# Patient Record
Sex: Female | Born: 1995 | Hispanic: Yes | Marital: Single | Smoking: Never smoker
Health system: Southern US, Community
[De-identification: ages and names within clinical notes are randomized; demographics above are authoritative.]

## PROBLEM LIST (undated history)

## (undated) DIAGNOSIS — J45909 Unspecified asthma, uncomplicated: Secondary | ICD-10-CM

## (undated) DIAGNOSIS — Z789 Other specified health status: Secondary | ICD-10-CM

## (undated) HISTORY — DX: Unspecified asthma, uncomplicated: J45.909

## (undated) HISTORY — PX: TONSILLECTOMY: SUR1361

---
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
## 2014-03-16 ENCOUNTER — Other Ambulatory Visit: Payer: Self-pay | Admitting: Pediatrics

## 2014-03-16 LAB — GLUCOSE, 2 HOUR
Glucose 2 Hour: 101 mg/dL
Glucose Fasting: 99 mg/dL (ref 70–110)

## 2017-11-19 LAB — HM PAP SMEAR: HM Pap smear: NEGATIVE

## 2017-12-25 ENCOUNTER — Encounter: Payer: Self-pay | Admitting: Obstetrics and Gynecology

## 2018-01-13 ENCOUNTER — Encounter: Payer: Self-pay | Admitting: Obstetrics and Gynecology

## 2018-01-13 ENCOUNTER — Ambulatory Visit (INDEPENDENT_AMBULATORY_CARE_PROVIDER_SITE_OTHER): Payer: BLUE CROSS/BLUE SHIELD | Admitting: Obstetrics and Gynecology

## 2018-01-13 VITALS — BP 112/50 | HR 92 | Ht 63.0 in | Wt 256.0 lb

## 2018-01-13 DIAGNOSIS — Z3009 Encounter for other general counseling and advice on contraception: Secondary | ICD-10-CM | POA: Diagnosis not present

## 2018-01-13 NOTE — Progress Notes (Signed)
Obstetrics & Gynecology Office Visit   Chief Complaint:  Chief Complaint  Patient presents with  . COnsultation birthcontrol    Discuss options    History of Present Illness: Patient is a 22 y.o. G0P0000 presenting for contraception consult.  She is currently on none and desiring to start Nexplanon.  She has a past medical history significant for no contraindication to estrogen.  She specifically denies a history of history of DVT/PE and smoking.  Reported Patient's last menstrual period was 12/26/2017 (approximate).     Review of Systems: Review of systems negative unless otherwise noted in HPI  Past Medical History:  Past Medical History:  Diagnosis Date  . Asthma     Past Surgical History:  History reviewed. No pertinent surgical history.  Gynecologic History: Patient's last menstrual period was 12/26/2017 (approximate).  Obstetric History: G0P0000  Family History:  History reviewed. No pertinent family history.  Social History:  Social History   Socioeconomic History  . Marital status: Single    Spouse name: Not on file  . Number of children: Not on file  . Years of education: Not on file  . Highest education level: Not on file  Occupational History  . Not on file  Social Needs  . Financial resource strain: Not on file  . Food insecurity:    Worry: Not on file    Inability: Not on file  . Transportation needs:    Medical: Not on file    Non-medical: Not on file  Tobacco Use  . Smoking status: Never Smoker  . Smokeless tobacco: Never Used  Substance and Sexual Activity  . Alcohol use: Yes    Comment: Occ  . Drug use: Never  . Sexual activity: Yes    Partners: Male    Birth control/protection: None  Lifestyle  . Physical activity:    Days per week: 2 days    Minutes per session: 30 min  . Stress: Not at all  Relationships  . Social connections:    Talks on phone: Not on file    Gets together: Not on file    Attends religious service: Not on  file    Active member of club or organization: Not on file    Attends meetings of clubs or organizations: Not on file    Relationship status: Not on file  . Intimate partner violence:    Fear of current or ex partner: Not on file    Emotionally abused: Not on file    Physically abused: Not on file    Forced sexual activity: Not on file  Other Topics Concern  . Not on file  Social History Narrative  . Not on file    Allergies:  No Known Allergies  Medications: Prior to Admission medications   Not on File    Physical Exam Vitals:  Vitals:   01/13/18 1411  BP: (!) 112/50  Pulse: 92   Patient's last menstrual period was 12/26/2017 (approximate).  General: NAD HEENT: normocephalic, anicteric Pulmonary: No increased work of breathing Extremities: no edema, erythema, or tenderness Neurologic: Grossly intact Psychiatric: mood appropriate, affect full  Female chaperone present for pelvic  portions of the physical exam  Assessment: 22 y.o. G0P0000 contraception consult  Plan: Problem List Items Addressed This Visit    None    Visit Diagnoses    Encounter for counseling regarding contraception    -  Primary     - Pap and GC/CT offered but had done at  health department 2 months ago  - Reviewed all forms of birth control options available including abstinence; over the counter/barrier methods; hormonal contraceptive medication including pill, patch, ring, injection,contraceptive implant; hormonal and nonhormonal IUDs; permanent sterilization options including vasectomy and the various tubal sterilization modalities. Risks and benefits reviewed.  Questions were answered.  Information was given to patient to review.  Most interested in IUD vs nexplanon  -Risks and benefits of IUD discussed including the risks of irregular bleeding, cramping, infection, malpositioning, expulsion or uterine perforation of the IUD (1:1000 placements)  which may require further procedures such as  laparoscopy.  IUDs while effective at preventing pregnancy do not prevent transmission of sexually transmitted diseases and use of barrier methods for this purpose was discussed.  Low overall incidence of failure with 99.7% efficacy rate in typical use.  The patient has not contraindication to IUD placement.  -She understands that Nexplanon is a progesterone only therapy, and that patients often patients have irregular and unpredictable vaginal bleeding or amenorrhea. She understands that other side effects are possible related to systemic progesterone, including but not limited to, headaches, breast tenderness, nausea, and irritability. While effective at preventing pregnancy long acting reversible contraceptives do not prevent transmission of sexually transmitted diseases and use of barrier methods for this purpose was discussed.  The placement procedure for Nexplanon was reviewed with the patient in detail including risks of nerve injury, infection, bleeding and injury to other muscles or tendons. She understands that the Nexplanon implant is good for 3 years and needs to be removed at the end of that time.  She understands that Nexplanon is an extremely effective option for contraception, with failure rate of <1%.  A total of 20 minutes were spent in face-to-face contact with the patient during this encounter with over half of that time devoted to counseling and coordination of care.  Return call with next menses for nexpalnon or kylena.   Vena Austria, MD, [facility], [facility] 01/13/2018, 7:59 PM

## 2018-01-13 NOTE — Patient Instructions (Signed)

## 2018-01-26 ENCOUNTER — Encounter: Payer: Self-pay | Admitting: Obstetrics and Gynecology

## 2018-01-26 ENCOUNTER — Ambulatory Visit (INDEPENDENT_AMBULATORY_CARE_PROVIDER_SITE_OTHER): Payer: BLUE CROSS/BLUE SHIELD | Admitting: Obstetrics and Gynecology

## 2018-01-26 ENCOUNTER — Telehealth: Payer: Self-pay | Admitting: Obstetrics and Gynecology

## 2018-01-26 VITALS — BP 106/70 | HR 73 | Ht 63.0 in | Wt 256.0 lb

## 2018-01-26 DIAGNOSIS — Z3009 Encounter for other general counseling and advice on contraception: Secondary | ICD-10-CM

## 2018-01-26 NOTE — Telephone Encounter (Signed)
Patient scheduled 5/17 for [facility] with AMS

## 2018-01-27 NOTE — Progress Notes (Signed)
Desire Kylena none in office, will order return in 3 days

## 2018-01-29 ENCOUNTER — Encounter: Payer: Self-pay | Admitting: Obstetrics and Gynecology

## 2018-01-29 ENCOUNTER — Ambulatory Visit (INDEPENDENT_AMBULATORY_CARE_PROVIDER_SITE_OTHER): Payer: BLUE CROSS/BLUE SHIELD | Admitting: Obstetrics and Gynecology

## 2018-01-29 VITALS — BP 110/82 | HR 78 | Wt 252.0 lb

## 2018-01-29 DIAGNOSIS — Z30430 Encounter for insertion of intrauterine contraceptive device: Secondary | ICD-10-CM | POA: Diagnosis not present

## 2018-01-29 NOTE — Progress Notes (Signed)
   GYNECOLOGY OFFICE PROCEDURE NOTE  Erica Le is a 22 y.o. G0P0000 here for [facility] a IUD insertion. No GYN concerns.  Last pap smear was with last year at [facility] and was normal.  The patient is currently using nothing for contraception and her LMP is Patient's last menstrual period was 01/23/2018.[name]  The indication for her IUD is contraception/cycle control.  Negative predictive value of 99%-100% - < OR = 7 days from the onset of a normal menstrual cycle - has not had sexual intercourse since the start of the last menstrual cycle - has correctly and consistently used a reliable form of contraception - < or = 7 days from an induced abortion - is within 4 weeks postpartum - is exclusively breast feeding (>85% of feeds), amenorrheic, and <6 months postpartum   "[place] Selected Practice Recommendations for Contraceptive Use", April 13, 2015   IUD Insertion Procedure Note Patient identified, informed consent performed, consent signed.   Discussed risks of irregular bleeding, cramping, infection, malpositioning, expulsion or uterine perforation of the IUD (1:1000 placements)  which may require further procedure such as laparoscopy.  IUD while effective at preventing pregnancy do not prevent transmission of sexually transmitted diseases and use of barrier methods for this purpose was discussed. Time out was performed.  Urine pregnancy test negative.  Speculum placed in the vagina.  Cervix visualized.  Cleaned with Betadine x 2.  Grasped anteriorly with a single tooth tenaculum.  Uterus sounded to 8cm cm. IUD placed per manufacturer's recommendations.  Strings trimmed to 3 cm. Tenaculum was removed, good hemostasis noted.  Patient tolerated procedure well.   Patient was given post-procedure instructions.  She was advised to have backup contraception for one week.  Patient was also asked to check IUD strings periodically and follow up in 6 weeks for IUD check.  Vena Austria, MD, [name],  [facility]

## 2018-02-09 NOTE — Telephone Encounter (Signed)
Kyleena received & charged 01/29/18

## 2018-03-12 ENCOUNTER — Encounter: Payer: Self-pay | Admitting: Obstetrics and Gynecology

## 2018-03-12 ENCOUNTER — Ambulatory Visit (INDEPENDENT_AMBULATORY_CARE_PROVIDER_SITE_OTHER): Payer: BLUE CROSS/BLUE SHIELD | Admitting: Obstetrics and Gynecology

## 2018-03-12 VITALS — BP 106/72 | HR 86 | Ht 63.0 in | Wt 252.0 lb

## 2018-03-12 DIAGNOSIS — Z30431 Encounter for routine checking of intrauterine contraceptive device: Secondary | ICD-10-CM

## 2018-03-12 NOTE — Progress Notes (Signed)
Obstetrics & Gynecology Office Visit   Chief Complaint:  Chief Complaint  Patient presents with  . Follow-up    IUD check     History of Present Illness: 22 y.o. patient presenting for follow up of Kylena IUD placement 6+ weeks ago.  The indication for her IUD was contraception.  She denies any complications since her IUD placement.  Still having some occasional spotting.  is not able to feel strings.  Continues to have bleeding, light, not associated with significant cramping.  Review of Systems: Review of Systems  Constitutional: Negative.   Gastrointestinal: Negative.   Skin: Negative.     Past Medical History:  Past Medical History:  Diagnosis Date  . Asthma     Past Surgical History:  No past surgical history on file.  Gynecologic History: No LMP recorded.  Obstetric History: G0P0000  Family History:  No family history on file.  Social History:  Social History   Socioeconomic History  . Marital status: Single    Spouse name: Not on file  . Number of children: Not on file  . Years of education: Not on file  . Highest education level: Not on file  Occupational History  . Not on file  Social Needs  . Financial resource strain: Not on file  . Food insecurity:    Worry: Not on file    Inability: Not on file  . Transportation needs:    Medical: Not on file    Non-medical: Not on file  Tobacco Use  . Smoking status: Never Smoker  . Smokeless tobacco: Never Used  Substance and Sexual Activity  . Alcohol use: Yes    Comment: Occ  . Drug use: Never  . Sexual activity: Yes    Partners: Male    Birth control/protection: None  Lifestyle  . Physical activity:    Days per week: 2 days    Minutes per session: 30 min  . Stress: Not at all  Relationships  . Social connections:    Talks on phone: Not on file    Gets together: Not on file    Attends religious service: Not on file    Active member of club or organization: Not on file    Attends  meetings of clubs or organizations: Not on file    Relationship status: Not on file  . Intimate partner violence:    Fear of current or ex partner: Not on file    Emotionally abused: Not on file    Physically abused: Not on file    Forced sexual activity: Not on file  Other Topics Concern  . Not on file  Social History Narrative  . Not on file    Allergies:  No Known Allergies  Medications: Prior to Admission medications   Medication Sig Start Date End Date Taking? Authorizing Provider  Levonorgestrel (KYLEENA) 19.5 MG IUD by Intrauterine route.   Yes [provider]    Physical Exam Blood pressure 106/72, pulse 86, height 5\' 3"  (1.6 m), weight 252 lb (114.3 kg), SpO2 98 %. No LMP recorded.  General: NAD HEENT: normocephalic, anicteric Pulmonary: No increased work of breathing  Genitourinary:  External: Normal external female genitalia.  Normal urethral meatus, normal  Bartholin's and Skene's glands.    Vagina: Normal vaginal mucosa, no evidence of prolapse.    Cervix: Grossly normal in appearance, no bleeding, IUD strings visualized 2cm  Uterus: Non-enlarged, mobile, normal contour.  No CMT  Adnexa: ovaries non-enlarged, no adnexal  masses  Rectal: deferred  Lymphatic: no evidence of inguinal lymphadenopathy Extremities: no edema, erythema, or tenderness Neurologic: Grossly intact Psychiatric: mood appropriate, affect full  Female chaperone present for pelvic and breast  portions of the physical exam  Assessment: 22 y.o. G0P0000 IUD string check  Plan: Problem List Items Addressed This Visit    None    Visit Diagnoses    IUD check up    -  Primary       1.  The patient was given instructions to check her IUD strings monthly and call with any problems or concerns.  She should call for fevers, chills, abnormal vaginal discharge, pelvic pain, or other complaints.  2.   IUDs while effective at preventing pregnancy do not prevent transmission of sexually  transmitted diseases and use of barrier methods for this purpose was discussed.  Low overall incidence of failure with 99.7% efficacy rate in typical use.  The patient has not contraindication to IUD placement.  3.  She will return for a annual exam in 1 year.  All questions answered.  4) A total of 15 minutes were spent in face-to-face contact with the patient during this encounter with over half of that time devoted to counseling and coordination of care.  5) Return in about 1 year (around 03/13/2019) for annual.   Vena Austria, MD, [name], [facility] 03/12/2018, 4:10 PM

## 2018-07-13 LAB — HM HIV SCREENING LAB: HM HIV Screening: NEGATIVE

## 2018-08-05 ENCOUNTER — Ambulatory Visit (INDEPENDENT_AMBULATORY_CARE_PROVIDER_SITE_OTHER): Payer: BLUE CROSS/BLUE SHIELD | Admitting: Obstetrics and Gynecology

## 2018-08-05 ENCOUNTER — Encounter: Payer: Self-pay | Admitting: Obstetrics and Gynecology

## 2018-08-05 VITALS — BP 104/73 | HR 72 | Ht 63.0 in | Wt 238.0 lb

## 2018-08-05 DIAGNOSIS — N939 Abnormal uterine and vaginal bleeding, unspecified: Secondary | ICD-10-CM

## 2018-08-05 DIAGNOSIS — Z30431 Encounter for routine checking of intrauterine contraceptive device: Secondary | ICD-10-CM

## 2018-08-05 NOTE — Progress Notes (Signed)
Obstetrics & Gynecology Office Visit   Chief Complaint:  Chief Complaint  Patient presents with  . Follow-up    IUD check    History of Present Illness: 22 y.o. patient presenting for follow up of Mirena IUD placement 01/29/2018 ago.  The indication for her IUD was contraception.  She admits to any complications since her IUD placement, started noting heavier bleeding some mild cramping in the past week. No fevers or chills.      Review of Systems: Review of Systems  Constitutional: Negative.   Gastrointestinal: Negative.   Genitourinary: Negative.     Past Medical History:  Past Medical History:  Diagnosis Date  . Asthma     Past Surgical History:  History reviewed. No pertinent surgical history.  Gynecologic History: No LMP recorded. (Menstrual status: IUD).  Obstetric History: G0P0000  Family History:  History reviewed. No pertinent family history.  Social History:  Social History   Socioeconomic History  . Marital status: Single    Spouse name: Not on file  . Number of children: Not on file  . Years of education: Not on file  . Highest education level: Not on file  Occupational History  . Not on file  Social Needs  . Financial resource strain: Not on file  . Food insecurity:    Worry: Not on file    Inability: Not on file  . Transportation needs:    Medical: Not on file    Non-medical: Not on file  Tobacco Use  . Smoking status: Never Smoker  . Smokeless tobacco: Never Used  Substance and Sexual Activity  . Alcohol use: Yes    Comment: Occ  . Drug use: Never  . Sexual activity: Yes    Partners: Male    Birth control/protection: None  Lifestyle  . Physical activity:    Days per week: 2 days    Minutes per session: 30 min  . Stress: Not at all  Relationships  . Social connections:    Talks on phone: Not on file    Gets together: Not on file    Attends religious service: Not on file    Active member of club or organization: Not on file      Attends meetings of clubs or organizations: Not on file    Relationship status: Not on file  . Intimate partner violence:    Fear of current or ex partner: Not on file    Emotionally abused: Not on file    Physically abused: Not on file    Forced sexual activity: Not on file  Other Topics Concern  . Not on file  Social History Narrative  . Not on file    Allergies:  No Known Allergies  Medications: Prior to Admission medications   Medication Sig Start Date End Date Taking? Authorizing Provider  Levonorgestrel (KYLEENA) 19.5 MG IUD by Intrauterine route.    [provider]    Physical Exam There were no vitals taken for this visit. No LMP recorded. (Menstrual status: IUD).  General: NAD HEENT: normocephalic, anicteric Pulmonary: No increased work of breathing  Genitourinary:  External: Normal external female genitalia.  Normal urethral meatus, normal  Bartholin's and Skene's glands.    Vagina: Normal vaginal mucosa, no evidence of prolapse.    Cervix: Grossly normal in appearance, no bleeding, IUD strings visualized 2cm  Uterus: Non-enlarged, mobile, normal contour.  No CMT  Adnexa: ovaries non-enlarged, no adnexal masses  Rectal: deferred  Lymphatic: no evidence  of inguinal lymphadenopathy Extremities: no edema, erythema, or tenderness Neurologic: Grossly intact Psychiatric: mood appropriate, affect full  Female chaperone present for pelvic and breast  portions of the physical exam  Assessment: 22 y.o. G0P0000 No problem-specific Assessment & Plan notes found for this encounter.   Plan: Problem List Items Addressed This Visit    None    Visit Diagnoses    Abnormal uterine bleeding    -  Primary   Relevant Orders   US Transvaginal Non-OB   IUD check up       Relevant Orders   US Transvaginal Non-OB       1.  The patient was given instructions to check her IUD strings monthly and call with any problems or concerns.  She should call for fevers,  chills, abnormal vaginal discharge, pelvic pain, or other complaints.  2.   IUDs while effective at preventing pregnancy do not prevent transmission of sexually transmitted diseases and use of barrier methods for this purpose was discussed.  Low overall incidence of failure with 99.7% efficacy rate in typical use.  The patient has not contraindication to IUD placement.  3.  She will return for a annual exam in 1 year.  All questions answered.  4) A total of 15 minutes were spent in face-to-face contact with the patient during this encounter with over half of that time devoted to counseling and coordination of care. - wet mount negative, TVUS IUD location  5) Return in about 1 week (around 08/12/2018) for 1-2 week TVUS and follow up [place] office.   Vena Austria, MD, [facility], [facility] 08/05/2018, 3:28 PM

## 2018-08-17 ENCOUNTER — Ambulatory Visit (INDEPENDENT_AMBULATORY_CARE_PROVIDER_SITE_OTHER): Payer: Self-pay

## 2018-08-17 ENCOUNTER — Ambulatory Visit (INDEPENDENT_AMBULATORY_CARE_PROVIDER_SITE_OTHER): Payer: BLUE CROSS/BLUE SHIELD | Admitting: Obstetrics and Gynecology

## 2018-08-17 ENCOUNTER — Encounter: Payer: Self-pay | Admitting: Obstetrics and Gynecology

## 2018-08-17 ENCOUNTER — Ambulatory Visit: Payer: Medicaid Other | Admitting: Obstetrics and Gynecology

## 2018-08-17 VITALS — BP 106/64 | Wt 237.0 lb

## 2018-08-17 DIAGNOSIS — Z30431 Encounter for routine checking of intrauterine contraceptive device: Secondary | ICD-10-CM

## 2018-08-17 DIAGNOSIS — N939 Abnormal uterine and vaginal bleeding, unspecified: Secondary | ICD-10-CM

## 2018-08-18 NOTE — Progress Notes (Signed)
Gynecology Ultrasound Follow Up  Chief Complaint:  Chief Complaint  Patient presents with  . Follow-up    GYN Ultrasound     History of Present Illness: Patient is a 22 y.o. female who presents today for ultrasound evaluation of AUB with Kyleena IUD.  Ultrasound demonstrates the following findgins Adnexa: normal Uterus: Non-enlarged, no evidence of fibroid with endometrial stripe 6mm, no focal lesion, IUD appropriately positioned and deployed within the endometrial cavity  Review of Systems  Constitutional: Negative.   Gastrointestinal: Negative.   Genitourinary: Negative.      Past Medical History:  Past Medical History:  Diagnosis Date  . Asthma     Past Surgical History:  History reviewed. No pertinent surgical history.  Gynecologic History:  No LMP recorded. (Menstrual status: IUD). Family History:  History reviewed. No pertinent family history.  Social History:  Social History   Socioeconomic History  . Marital status: Single    Spouse name: Not on file  . Number of children: Not on file  . Years of education: Not on file  . Highest education level: Not on file  Occupational History  . Not on file  Social Needs  . Financial resource strain: Not on file  . Food insecurity:    Worry: Not on file    Inability: Not on file  . Transportation needs:    Medical: Not on file    Non-medical: Not on file  Tobacco Use  . Smoking status: Never Smoker  . Smokeless tobacco: Never Used  Substance and Sexual Activity  . Alcohol use: Yes    Comment: Occ  . Drug use: Never  . Sexual activity: Yes    Partners: Male    Birth control/protection: None  Lifestyle  . Physical activity:    Days per week: 2 days    Minutes per session: 30 min  . Stress: Not at all  Relationships  . Social connections:    Talks on phone: Not on file    Gets together: Not on file    Attends religious service: Not on file    Active member of club or organization: Not on file      Attends meetings of clubs or organizations: Not on file    Relationship status: Not on file  . Intimate partner violence:    Fear of current or ex partner: Not on file    Emotionally abused: Not on file    Physically abused: Not on file    Forced sexual activity: Not on file  Other Topics Concern  . Not on file  Social History Narrative  . Not on file    Allergies:  No Known Allergies  Medications: Prior to Admission medications   Medication Sig Start Date End Date Taking? Authorizing Provider  Levonorgestrel (KYLEENA) 19.5 MG IUD by Intrauterine route.    [provider]    Physical Exam Vitals: Blood pressure 106/64, weight 237 lb (107.5 kg).  General: NAD HEENT: normocephalic, anicteric Pulmonary: No increased work of breathing Extremities: no edema, erythema, or tenderness Neurologic: Grossly intact, normal gait Psychiatric: mood appropriate, affect full  US Transvaginal Non-ob  Result Date: 08/18/2018 Patient Name: Erica Le DOB: 16-Jul-1996 MRN: [id number] ULTRASOUND REPORT Location: [facility] Date of Service: 08/17/2018 Indications:AUB Findings: The uterus is anteverted and measures 6.6 x 3.9 x 3.1cm. Echo texture is homogenous without evidence of focal masses. The Endometrium measures 6.4 mm. IUD in place. Right Ovary measures 4.0 x 3.2 x 2.4 cm. It  is normal in appearance. Left Ovary measures 3.7 x 2.5 x 2.0 cm. It is normal in appearance. Survey of the adnexa demonstrates no adnexal masses. There is no free fluid in the cul de sac. Impression: 1. Normal gyn ultrasound. Recommendations: 1.Clinical correlation with the patient's History and Physical Exam. Darlina GuysAbby M Clarke, RDMS RVT There is a singleton gestation with normal amniotic fluid volume. The fetal biometry correlates with established dating.  Limited fetal anatomy was performed.The visualized fetal anatomical survey appears within normal limits within the resolution of ultrasound as described above.   It must be noted that a normal ultrasound is unable to rule out fetal aneuploidy.  Vena AustriaAndreas Omayra Tulloch, MD, [facility]linda FrederickFACOG [facility], [facility]     Assessment: 22 y.o. G0P0000 No problem-specific Assessment & Plan notes found for this encounter.   Plan: Problem List Items Addressed This Visit    None    Visit Diagnoses    Abnormal uterine bleeding    -  Primary   IUD check up          1) IUD - confirmed to be in proper position within the uterus with proper deployment of both arms.  There are no uterine or endometrial structural abnormalities.  At present opts for expectant management.  If continues to have breakthrough bleeding consider course of premarin po or removal of IUD with switch to alternative contraceptive option.  2) A total of 15 minutes were spent in face-to-face contact with the patient during this encounter with over half of that time devoted to counseling and coordination of care.  3) Return in about 1 year (around 08/18/2019) for annual.    Vena AustriaAndreas Kemoni Ortega, MD, [facility]inda FrederickFACOG [facility], [facility] 08/18/2018, 1:25 PM

## 2019-09-26 ENCOUNTER — Ambulatory Visit: Payer: Self-pay

## 2019-09-26 ENCOUNTER — Ambulatory Visit: Payer: Medicaid Other | Admitting: Family Medicine

## 2019-09-26 ENCOUNTER — Other Ambulatory Visit: Payer: Self-pay

## 2019-09-26 ENCOUNTER — Ambulatory Visit (LOCAL_COMMUNITY_HEALTH_CENTER): Payer: Medicaid Other

## 2019-09-26 ENCOUNTER — Encounter: Payer: Self-pay | Admitting: Family Medicine

## 2019-09-26 VITALS — BP 111/73 | Ht 63.0 in | Wt 246.2 lb

## 2019-09-26 DIAGNOSIS — Z113 Encounter for screening for infections with a predominantly sexual mode of transmission: Secondary | ICD-10-CM

## 2019-09-26 DIAGNOSIS — Z23 Encounter for immunization: Secondary | ICD-10-CM

## 2019-09-26 DIAGNOSIS — B379 Candidiasis, unspecified: Secondary | ICD-10-CM

## 2019-09-26 DIAGNOSIS — B9689 Other specified bacterial agents as the cause of diseases classified elsewhere: Secondary | ICD-10-CM

## 2019-09-26 DIAGNOSIS — Z3009 Encounter for other general counseling and advice on contraception: Secondary | ICD-10-CM

## 2019-09-26 LAB — WET PREP FOR TRICH, YEAST, CLUE
Clue Cell Exam: POSITIVE — AB
Trichomonas Exam: NEGATIVE

## 2019-09-26 MED ORDER — THERA VITAL M PO TABS: 1.0000 | ORAL_TABLET | Freq: Every day | ORAL | 3 refills | Status: AC

## 2019-09-26 MED ORDER — METRONIDAZOLE 500 MG PO TABS: 500.0000 mg | ORAL_TABLET | Freq: Two times a day (BID) | ORAL | 0 refills | Status: AC

## 2019-09-26 MED ORDER — CLOTRIMAZOLE 1 % VA CREA: 1.0000 | TOPICAL_CREAM | Freq: Every day | VAGINAL | 0 refills | Status: AC

## 2019-09-26 NOTE — Progress Notes (Signed)
Family Planning Visit- Initial Visit  Subjective:  Erica Le is a 24 y.o. being seen today for an initial well woman visit and to discuss family planning options.  She is currently using IUD for pregnancy prevention. Patient reports she does not want a pregnancy in the next year.  Patient has the following medical conditions does not have a problem list on file.  Chief Complaint  Patient presents with  . Contraception    Patient reports she would like STI testing. About 2 wks ago she had thick yellow vaginal discharge, burning with urination and tender lumps along bikini line. All of these symptoms are now resolving. Denies fever, n/v, abd pain, ulcers or rashes. Has had occ pain with sex ever since [place] was inserted 1.5 yrs ago, this has not changed or worsened in past 2 wks.   Last pap: 11/19/2017: NILM BCM: kyleena, sometimes condoms. D LMP: does not get periods +hx drug use, accepts HBV, HCV testing   Body mass index is 43.61 kg/m. - Patient is eligible for diabetes screening based on BMI and age >63?  not applicable HA1C ordered? not applicable  Patient reports 12 of partners in last year. Desires STI screening?  Yes  Does the patient have a current or past history of drug use? Yes   No components found for: HCV]   Health Maintenance Due  Topic Date Due  . INFLUENZA VACCINE  04/16/2019    ROS ROS negative except for  Unexpl bleeding from vagina: occurs after sex x several years.  The following portions of the patient's history were reviewed and updated as appropriate: allergies, current medications, past family history, past medical history, past social history, past surgical history and problem list. Problem list updated.   See flowsheet for other program required questions.  Objective:   Vitals:   09/26/19 1054  BP: 111/73  Weight: 246 lb 3.2 oz (111.7 kg)  Height: 5\' 3"  (1.6 m)    Physical Exam Vitals and nursing note reviewed.  Constitutional:      Appearance: Normal appearance.  HENT:     Head: Normocephalic and atraumatic.     Mouth/Throat:     Mouth: Mucous membranes are moist.     Pharynx: Oropharynx is clear. No oropharyngeal exudate or posterior oropharyngeal erythema.  Eyes:     Conjunctiva/sclera: Conjunctivae normal.  Neck:     Thyroid: No thyroid mass, thyromegaly or thyroid tenderness.  Cardiovascular:     Rate and Rhythm: Normal rate and regular rhythm.     Pulses: Normal pulses.     Heart sounds: Normal heart sounds.  Pulmonary:     Effort: Pulmonary effort is normal.     Breath sounds: Normal breath sounds.  Chest:     Breasts:        Right: Normal. No swelling, mass, nipple discharge, skin change or tenderness. +nipple piercing present       Left: Normal. No swelling, mass, nipple discharge, skin change or tenderness. +nipple piercing present Abdominal:     General: Abdomen is flat.     Palpations: There is no mass.     Tenderness: There is no abdominal tenderness. There is no rebound.  Genitourinary:     General: Normal vulva.     Exam position: Lithotomy position.     Pubic Area: No rash or pubic lice.      Labia:        Right: No rash or lesion.  Left: No rash or lesion.      Vagina: Normal. Vaginal discharge (scant, white, ph >4.5) present. No erythema, bleeding or lesions.     Cervix: No cervical motion tenderness, discharge, friability, lesion or erythema.     Uterus: Normal.      Adnexa: Right adnexa normal and left adnexa normal.     Rectum: Normal.  Lymphadenopathy:     Head:     Right side of head: No preauricular or posterior auricular adenopathy.     Left side of head: No preauricular or posterior auricular adenopathy.     Cervical: No cervical adenopathy.     Upper Body:     Right upper body: No supraclavicular or axillary adenopathy.     Left upper body: No supraclavicular or axillary adenopathy.     Lower Body:  Right inguinal adenopathy (~2cm lymph enlargement) present. Left  inguinal adenopathy (~1cm lymph enlargement) present. Skin:    General: Skin is warm and dry.     Findings: No rash.  Neurological:     Mental Status: She is alert and oriented to person, place, and time.      Assessment and Plan:  Erica Le is a 24 y.o. female presenting to the [facility] Department for an initial well woman exam/family planning visit  Contraception counseling: N/a - pt has kyleena and is happy with it. Declines contraception counseling.  She was told to call with any further questions, or with any concerns about this method of contraception.  Emphasized use of condoms 100% of the time for STI prevention.  ECP n/a.   1. Family planning services -Complete physical today.  -Will investigate recent hx of vaginal dc, inguinal lymphadenopathy with screenings as below. Symptoms are resolving per pt, though advised to RTC if labs negative and they worsen or fail to resolve completely. - Multiple Vitamins-Minerals (MULTIVITAMIN) tablet; Take 1 tablet by mouth daily.  Dispense: 30 tablet; Refill: 3  2. Screening examination for venereal disease -Screenings today as below. Treat wet prep per standing order. -Patient does meet criteria for HepB, HepC Screening. Accepts these screenings. -Counseled on warning s/sx and when to seek care. Recommended condom use with all sex and discussed importance of condom use for STI prevention. - Syphilis Serology, [place] [facility] - HIV/HCV [place] [facility] - HBV Antigen/Antibody [place] [facility] - Chlamydia/Gonorrhea [place] [facility] - WET PREP FOR [place], YEAST, CLUE  3. Bacterial vaginosis Wet prep + for BV, RN treated per standing order, see separate documentation. - metroNIDAZOLE (FLAGYL) 500 MG tablet; Take 1 tablet (500 mg total) by mouth 2 (two) times daily for 7 days.  Dispense: 14 tablet; Refill: 0  4. Yeast infection Wet prep + for yeast, RN treated per standing order, see separate documentation. - clotrimazole (V-R CLOTRIMAZOLE  VAGINAL) 1 % vaginal cream; Place 1 Applicatorful vaginally at bedtime for 7 days.  Dispense: 45 g; Refill: 0  Return in about 1 year (around 09/25/2020) for yearly wellness exam.  No future appointments.  Kandee Keen, PA-C

## 2019-09-26 NOTE — Progress Notes (Signed)
Td adm. R. delt-well tolerated Sharlette Dense, RN

## 2019-09-26 NOTE — Progress Notes (Signed)
In for visit; c/o recent Rt. Side pain and "lump", with discharge; denies currently Sharlette Dense, RN Wet prep reviewed-+BV and +Yeast treated per standing order Sharlette Dense, RN

## 2019-10-04 LAB — HEPATITIS B SURFACE ANTIGEN

## 2019-10-12 ENCOUNTER — Telehealth: Payer: Self-pay

## 2019-10-12 DIAGNOSIS — Z205 Contact with and (suspected) exposure to viral hepatitis: Secondary | ICD-10-CM

## 2019-10-12 HISTORY — DX: Contact with and (suspected) exposure to viral hepatitis: Z20.5

## 2019-10-12 NOTE — Telephone Encounter (Signed)
TC with patient.  Discussed past Hep C infection and other results from last visit. Patient reports in the past year she has gotten tattoos on 3 different occasions.  States it wasn't in a tattoo shop but knows the needles were new; RN asked about fresh ink and patient isn't sure. Denies jaundice, abdominal pain or injection drug use.  Referred patient to [facility] for f/u d/t no insurance or medicaid. Patient verbalized understanding Richmond Campbell, RN

## 2020-05-01 ENCOUNTER — Encounter: Payer: Self-pay | Admitting: Physician Assistant

## 2020-05-01 ENCOUNTER — Ambulatory Visit: Payer: Self-pay | Admitting: Physician Assistant

## 2020-05-01 ENCOUNTER — Other Ambulatory Visit: Payer: Self-pay

## 2020-05-01 DIAGNOSIS — Z299 Encounter for prophylactic measures, unspecified: Secondary | ICD-10-CM

## 2020-05-01 DIAGNOSIS — Z113 Encounter for screening for infections with a predominantly sexual mode of transmission: Secondary | ICD-10-CM

## 2020-05-01 LAB — WET PREP FOR TRICH, YEAST, CLUE
Trichomonas Exam: NEGATIVE
Yeast Exam: NEGATIVE

## 2020-05-01 MED ORDER — ACYCLOVIR 400 MG PO TABS: 400.0000 mg | ORAL_TABLET | Freq: Three times a day (TID) | ORAL | 0 refills | Status: AC

## 2020-05-01 NOTE — Progress Notes (Signed)
[facility] Department STI clinic/screening visit  Subjective:  Erica Le is a 24 y.o. female being seen today for an STI screening visit. The patient reports they do have symptoms.  Patient reports that they do not desire a pregnancy in the next year.   They reported they are not interested in discussing contraception today.  No LMP recorded. (Menstrual status: IUD).   Patient has the following medical conditions:   Patient Active Problem List   Diagnosis Date Noted  . Exposure to hepatitis C 10/12/2019    Chief Complaint  Patient presents with  . SEXUALLY TRANSMITTED DISEASE    screening    HPI  Patient reports that she has had some itching/burning since 04/25/2020 and on 04/26/2020, noticed some "spots or sores" in her genital area that have gotten worse/spread since then and the itching/burning sensation has also gotten worse since then.  States that she may have also had sores in her mouth but they have mostly resolved.  Denies chronic conditions and regular medicines.  States she still has her IUD in place and does not have periods with the IUD.  Reports last HIV test was 6-7 months ago and last pap was in 2019.  See flowsheet for further details and programmatic requirements.    The following portions of the patient's history were reviewed and updated as appropriate: allergies, current medications, past medical history, past social history, past surgical history and problem list.  Objective:  There were no vitals filed for this visit.  Physical Exam Constitutional:      General: She is not in acute distress.    Appearance: Normal appearance.  HENT:     Head: Normocephalic and atraumatic.     Comments: No nits, lice, or hair loss. No cervical, supraclavicular or axillary adenopathy.    Mouth/Throat:     Mouth: Mucous membranes are moist.     Pharynx: Oropharynx is clear. No oropharyngeal exudate or posterior oropharyngeal erythema.  Eyes:      Conjunctiva/sclera: Conjunctivae normal.  Pulmonary:     Effort: Pulmonary effort is normal.  Abdominal:     Palpations: Abdomen is soft. There is no mass.     Tenderness: There is no abdominal tenderness. There is no guarding or rebound.  Genitourinary:    Rectum: Normal.     Comments: External genitalia/pubic area without nits or lice. Shotty inguinal adenopathy bilaterally. Upper inner labia majora with scattered, partially healed, shallow ulcerative lesions, tender to culture. Lower inner labia majora and perineal area with multiple, scattered, healed and partially healed shallow ulcerative lesions, tender to culture. Vagina with mild erythema, moderate amount of white discharge, pH=4.5. Cervix without visible lesions, some erythema and bleeds after endocervical culture collected. Uterus firm, mobile, nt, no masses, no CMT, no adnexal tenderness or fullness. Musculoskeletal:     Cervical back: Neck supple. No tenderness.  Skin:    General: Skin is warm and dry.     Findings: No bruising, erythema, lesion or rash.  Neurological:     Mental Status: She is alert and oriented to person, place, and time.  Psychiatric:        Mood and Affect: Mood normal.        Behavior: Behavior normal.        Thought Content: Thought content normal.        Judgment: Judgment normal.      Assessment and Plan:  Erica Le is a 24 y.o. female presenting to the [facility]  Department for STI screening  1. Screening for STD (sexually transmitted disease) Patient into clinic with symptoms. Rec condoms with all sex. Await test results.  Counseled that RN will call if needs to RTC for treatment once results are back. - WET PREP FOR [place], YEAST, CLUE - Gonococcus culture - Chlamydia/Gonorrhea [place] [facility] - HIV [place] [facility] - Syphilis Serology, [place] [facility] - Virology, [place] [facility]  2. 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 Will give Acyclovir 400 mg #30 1 po TID for 10 days while awaiting test results. No sex until after lesions healed and results are back.  Call with further questions or concerns.  - acyclovir (ZOVIRAX) 400 MG tablet; Take 1 tablet (400 mg total) by mouth 3 (three) times daily for 10 days.  Dispense: 30 tablet; Refill: 0     No follow-ups on file.  No future appointments.  Matt Holmes, PA

## 2020-05-01 NOTE — Progress Notes (Signed)
Wet mount reviewed. Patient given Acyclovir #30 per C. Hampton PA VO. Richmond Campbell, RN

## 2020-05-06 LAB — GONOCOCCUS CULTURE

## 2020-05-08 ENCOUNTER — Telehealth: Payer: Self-pay | Admitting: Family Medicine

## 2020-05-08 MED ORDER — ACYCLOVIR 800 MG PO TABS: 800.0000 mg | ORAL_TABLET | Freq: Every day | ORAL | 11 refills | Status: AC

## 2020-05-08 NOTE — Telephone Encounter (Signed)
TC with patient.  Verified ID via password/SS#. Discussed +HSV  results, disease process and treatment options. Questions and concerns addressed. Patient verbalized understanding of results. Requests Acyclovir be called into [facility] on [address] (near [employer]).  Wants suppressive therapy. Wendi Snipes, RN

## 2020-07-25 ENCOUNTER — Encounter: Payer: Self-pay | Admitting: Emergency Medicine

## 2020-07-25 ENCOUNTER — Other Ambulatory Visit: Payer: Self-pay

## 2020-07-25 ENCOUNTER — Emergency Department
Admission: EM | Admit: 2020-07-25 | Discharge: 2020-07-26 | Disposition: A | Discharge status: Home / Self Care | Payer: Medicaid Other | Attending: Emergency Medicine | Admitting: Emergency Medicine

## 2020-07-25 ENCOUNTER — Emergency Department: Payer: Medicaid Other

## 2020-07-25 DIAGNOSIS — B9689 Other specified bacterial agents as the cause of diseases classified elsewhere: Secondary | ICD-10-CM | POA: Insufficient documentation

## 2020-07-25 DIAGNOSIS — R1031 Right lower quadrant pain: Secondary | ICD-10-CM | POA: Insufficient documentation

## 2020-07-25 DIAGNOSIS — R109 Unspecified abdominal pain: Secondary | ICD-10-CM

## 2020-07-25 DIAGNOSIS — N12 Tubulo-interstitial nephritis, not specified as acute or chronic: Secondary | ICD-10-CM

## 2020-07-25 DIAGNOSIS — N10 Acute tubulo-interstitial nephritis: Secondary | ICD-10-CM | POA: Insufficient documentation

## 2020-07-25 DIAGNOSIS — N390 Urinary tract infection, site not specified: Secondary | ICD-10-CM

## 2020-07-25 DIAGNOSIS — R110 Nausea: Secondary | ICD-10-CM | POA: Insufficient documentation

## 2020-07-25 DIAGNOSIS — R509 Fever, unspecified: Secondary | ICD-10-CM | POA: Insufficient documentation

## 2020-07-25 LAB — URINALYSIS, COMPLETE (UACMP) WITH MICROSCOPIC
Bacteria, UA: NONE SEEN
Bilirubin Urine: NEGATIVE
Glucose, UA: NEGATIVE mg/dL
Ketones, ur: NEGATIVE mg/dL
Nitrite: NEGATIVE
Protein, ur: 30 mg/dL — AB
Specific Gravity, Urine: 1.026 (ref 1.005–1.030)
WBC, UA: >50 WBC/hpf — ABNORMAL HIGH (ref 0–5)
pH: 5 (ref 5.0–8.0)

## 2020-07-25 LAB — CBC WITH DIFFERENTIAL/PLATELET
Abs Immature Granulocytes: 0.03 10*3/uL (ref 0.00–0.07)
Basophils Absolute: 0 10*3/uL (ref 0.0–0.1)
Basophils Relative: 0 %
Eosinophils Absolute: 0.1 10*3/uL (ref 0.0–0.5)
Eosinophils Relative: 1 %
HCT: 34.3 % — ABNORMAL LOW (ref 36.0–46.0)
Hemoglobin: 11.4 g/dL — ABNORMAL LOW (ref 12.0–15.0)
Immature Granulocytes: 0 %
Lymphocytes Relative: 8 %
Lymphs Abs: 0.7 10*3/uL (ref 0.7–4.0)
MCH: 30 pg (ref 26.0–34.0)
MCHC: 33.2 g/dL (ref 30.0–36.0)
MCV: 90.3 fL (ref 80.0–100.0)
Monocytes Absolute: 0.6 10*3/uL (ref 0.1–1.0)
Monocytes Relative: 6 %
Neutro Abs: 7.5 10*3/uL (ref 1.7–7.7)
Neutrophils Relative %: 85 %
Platelets: 190 10*3/uL (ref 150–400)
RBC: 3.8 MIL/uL — ABNORMAL LOW (ref 3.87–5.11)
RDW: 14.1 % (ref 11.5–15.5)
WBC: 8.9 10*3/uL (ref 4.0–10.5)
nRBC: 0 % (ref 0.0–0.2)

## 2020-07-25 LAB — COMPREHENSIVE METABOLIC PANEL
ALT: 25 U/L (ref 0–44)
AST: 31 U/L (ref 15–41)
Albumin: 3.7 g/dL (ref 3.5–5.0)
Alkaline Phosphatase: 70 U/L (ref 38–126)
Anion gap: 9 (ref 5–15)
BUN: 16 mg/dL (ref 6–20)
CO2: 22 mmol/L (ref 22–32)
Calcium: 8.4 mg/dL — ABNORMAL LOW (ref 8.9–10.3)
Chloride: 108 mmol/L (ref 98–111)
Creatinine, Ser: 1.02 mg/dL — ABNORMAL HIGH (ref 0.44–1.00)
GFR, Estimated: >60 mL/min (ref >60)
Glucose, Bld: 120 mg/dL — ABNORMAL HIGH (ref 70–99)
Potassium: 3.9 mmol/L (ref 3.5–5.1)
Sodium: 139 mmol/L (ref 135–145)
Total Bilirubin: 0.8 mg/dL (ref 0.3–1.2)
Total Protein: 7.3 g/dL (ref 6.5–8.1)

## 2020-07-25 LAB — POC URINE PREG, ED: Preg Test, Ur: NEGATIVE

## 2020-07-25 MED ORDER — SODIUM CHLORIDE 0.9 % IV SOLN
1.0000 g | Freq: Once | INTRAVENOUS | Status: AC
  Administered 2020-07-26: 1 g via INTRAVENOUS
  Filled 2020-07-25: qty 10

## 2020-07-25 MED ORDER — KETOROLAC TROMETHAMINE 30 MG/ML IJ SOLN
10.0000 mg | Freq: Once | INTRAMUSCULAR | Status: AC
  Administered 2020-07-26: 9.9 mg via INTRAVENOUS
  Filled 2020-07-25: qty 1

## 2020-07-25 MED ORDER — SODIUM CHLORIDE 0.9 % IV BOLUS
1000.0000 mL | Freq: Once | INTRAVENOUS | Status: AC
  Administered 2020-07-26: 1000 mL via INTRAVENOUS

## 2020-07-25 MED ORDER — ONDANSETRON HCL 4 MG/2ML IJ SOLN
4.0000 mg | Freq: Once | INTRAMUSCULAR | Status: AC
  Administered 2020-07-26: 4 mg via INTRAVENOUS
  Filled 2020-07-25: qty 2

## 2020-07-25 NOTE — ED Provider Notes (Signed)
[facility] Emergency Department Provider Note   ____________________________________________   First MD Initiated Contact with Patient 07/25/20 2346     (approximate)  I have reviewed the triage vital signs and the nursing notes.   HISTORY  Chief Complaint Flank Pain    HPI Erica Le is a 24 y.o. female who presents to the ED from home with a chief complaint of right-sided flank pain x4 days.  Reports subjective fever and chills at home.  Complains of right flank pain radiating to her right lower abdomen associated with nausea and hematuria.  No prior history of kidney stones.  Denies cough, chest pain, shortness of breath, vomiting or diarrhea.  Denies recent travel or trauma.     Past Medical History:  Diagnosis Date  . Asthma     Patient Active Problem List   Diagnosis Date Noted  . Exposure to hepatitis C 10/12/2019    Past Surgical History:  Procedure Laterality Date  . TONSILLECTOMY      Prior to Admission medications   Medication Sig Start Date End Date Taking? Authorizing Provider  acyclovir (ZOVIRAX) 800 MG tablet Take 1 tablet (800 mg total) by mouth daily. 05/08/20 05/09/21  Larene Pickett, FNP  cephALEXin (KEFLEX) 500 MG capsule Take 1 capsule (500 mg total) by mouth 3 (three) times daily. 07/26/20   Irean Hong, MD  Levonorgestrel [facility]) 19.5 MG IUD by Intrauterine route.    [provider]  ondansetron (ZOFRAN ODT) 4 MG disintegrating tablet Take 1 tablet (4 mg total) by mouth every 8 (eight) hours as needed for nausea or vomiting. 07/26/20   Irean Hong, MD    Allergies Apple and Other  No family history on file.  Social History Social History   Tobacco Use  . Smoking status: Never Smoker  . Smokeless tobacco: Never Used  Vaping Use  . Vaping Use: Some days  Substance Use Topics  . Alcohol use: Yes    Comment: 2x/mo  . Drug use: Never    Review of Systems  Constitutional: No fever/chills Eyes:  No visual changes. ENT: No sore throat. Cardiovascular: Denies chest pain. Respiratory: Denies shortness of breath. Gastrointestinal: Positive for right flank and right lower abdominal pain and nausea, no vomiting.  No diarrhea.  No constipation. Genitourinary: Positive for hematuria.  Negative for dysuria. Musculoskeletal: Negative for back pain. Skin: Negative for rash. Neurological: Negative for headaches, focal weakness or numbness.   ____________________________________________   PHYSICAL EXAM:  VITAL SIGNS: ED Triage Vitals  Enc Vitals Group     BP 07/25/20 2300 112/64     Pulse Rate 07/25/20 2012 (!) 108     Resp 07/25/20 2012 18     Temp 07/25/20 2012 99.2 F (37.3 C)     Temp Source 07/25/20 2012 Oral     SpO2 07/25/20 2012 97 %     Weight 07/25/20 2012 224 lb (101.6 kg)     Height 07/25/20 2012 5\' 3"  (1.6 m)     Head Circumference --      Peak Flow --      Pain Score 07/25/20 2011 4     Pain Loc --      Pain Edu? --      Excl. in GC? --     Constitutional: Alert and oriented. Well appearing and in mild acute distress. Eyes: Conjunctivae are normal. PERRL. EOMI. Head: Atraumatic. Nose: No congestion/rhinnorhea. Mouth/Throat: Mucous membranes are moist.   Neck: No stridor.  Cardiovascular: Normal rate, regular rhythm. Grossly normal heart sounds.  Good peripheral circulation. Respiratory: Normal respiratory effort.  No retractions. Lungs CTAB. Gastrointestinal: Soft and nontender to light or deep palpation. No distention. No abdominal bruits.  Mild right CVA tenderness. Musculoskeletal: No lower extremity tenderness nor edema.  No joint effusions. Neurologic:  Normal speech and language. No gross focal neurologic deficits are appreciated. No gait instability. Skin:  Skin is warm, dry and intact. No rash noted.  No vesicles. Psychiatric: Mood and affect are normal. Speech and behavior are normal.  ____________________________________________   LABS (all  labs ordered are listed, but only abnormal results are displayed)  Labs Reviewed  CBC WITH DIFFERENTIAL/PLATELET - Abnormal; Notable for the following components:      Result Value   RBC 3.80 (*)    Hemoglobin 11.4 (*)    HCT 34.3 (*)    All other components within normal limits  COMPREHENSIVE METABOLIC PANEL - Abnormal; Notable for the following components:   Glucose, Bld 120 (*)    Creatinine, Ser 1.02 (*)    Calcium 8.4 (*)    All other components within normal limits  URINALYSIS, COMPLETE (UACMP) WITH MICROSCOPIC - Abnormal; Notable for the following components:   Color, Urine YELLOW (*)    APPearance HAZY (*)    Hgb urine dipstick SMALL (*)    Protein, ur 30 (*)    Leukocytes,Ua MODERATE (*)    WBC, UA >50 (*)    All other components within normal limits  CULTURE, BLOOD (ROUTINE X 2)  CULTURE, BLOOD (ROUTINE X 2)  LACTIC ACID, PLASMA  POC URINE PREG, ED   ____________________________________________  EKG  None ____________________________________________  RADIOLOGY I, Carrina Schoenberger J, personally viewed and evaluated these images (plain radiographs) as part of my medical decision making, as well as reviewing the written report by the radiologist.  ED MD interpretation: No stones, no hydronephrosis, suspicious for right-sided pyelonephritis  Official radiology report(s): CT Renal Stone Study  Result Date: 07/26/2020 CLINICAL DATA:  Right-sided flank pain EXAM: CT ABDOMEN AND PELVIS WITHOUT CONTRAST TECHNIQUE: Multidetector CT imaging of the abdomen and pelvis was performed following the standard protocol without IV contrast. COMPARISON:  None. FINDINGS: Lower chest: The lung bases are clear. The heart size is normal. Hepatobiliary: The liver is normal. Normal gallbladder.There is no biliary ductal dilation. Pancreas: Normal contours without ductal dilatation. No peripancreatic fluid collection. Spleen: Unremarkable. Adrenals/Urinary Tract: --Adrenal glands: Unremarkable.  --Right kidney/ureter: The right kidney appears somewhat enlarged when compared to the left. There is mild adjacent fat stranding. There is no hydronephrosis. There is no radiopaque obstructing kidney stone. --Left kidney/ureter: No hydronephrosis or radiopaque kidney stones. --Urinary bladder: Unremarkable. Stomach/Bowel: --Stomach/Duodenum: No hiatal hernia or other gastric abnormality. Normal duodenal course and caliber. --Small bowel: Unremarkable. --Colon: Unremarkable. --Appendix: Normal. Vascular/Lymphatic: Normal course and caliber of the major abdominal vessels. --No retroperitoneal lymphadenopathy. --No mesenteric lymphadenopathy. --No pelvic or inguinal lymphadenopathy. Reproductive: There is an IUD in place. Other: No ascites or free air. The abdominal wall is normal. Musculoskeletal. No acute displaced fractures. IMPRESSION: 1. No hydronephrosis.  No radiopaque kidney stones. 2. There is several subtle findings involving the right kidney that raise suspicion for underlying right-sided pyelonephritis. Correlation with urinalysis is recommended. 3. Normal appendix. Electronically Signed   By: Katherine Mantle M.D.   On: 07/26/2020 00:10    ____________________________________________   PROCEDURES  Procedure(s) performed (including Critical Care):  Procedures   ____________________________________________   INITIAL IMPRESSION / ASSESSMENT AND PLAN / ED  COURSE  As part of my medical decision making, I reviewed the following data within the electronic MEDICAL RECORD NUMBER Nursing notes reviewed and incorporated, Labs reviewed, Old chart reviewed, Radiograph reviewed, Notes from prior ED visits and [place] Controlled Substance Database     24 year old female presenting with right flank pain, subjective fever and chills. Differential diagnosis includes, but is not limited to, ovarian cyst, ovarian torsion, acute appendicitis, diverticulitis, urinary tract infection/pyelonephritis, endometriosis,  bowel obstruction, colitis, renal colic, gastroenteritis, hernia, fibroids, endometriosis, pregnancy related pain including ectopic pregnancy, etc.  Laboratory results demonstrate normal WBC and renal function, moderate leukocytes in UA.  Given subjective fever/chills, will check blood cultures, lactic acid.  Initiate IV hydration, IV Rocephin for UTI, IV Toradol and Zofran for pain and nausea.  We will proceed with CT renal colic study to evaluate for obstructive uropathy.   Clinical Course as of Jul 26 534  Thu Jul 26, 2020  0125 Patient resting in no acute distress.  Updated her on negative lactic acid and CT scan.  Will discharge after completion of IV antibiotics/fluids.  Will discharge home on Keflex, Zofran ODT as needed.  Patient will follow up with her PCP.  Strict return precautions given.  Patient verbalizes understanding and agrees with plan of care.   [JS]    Clinical Course User Index [JS] Irean Hong, MD     ____________________________________________   FINAL CLINICAL IMPRESSION(S) / ED DIAGNOSES  Final diagnoses:  Right flank pain  Lower urinary tract infectious disease  Pyelonephritis     ED Discharge Orders         Ordered    cephALEXin (KEFLEX) 500 MG capsule  3 times daily        07/26/20 0126    ondansetron (ZOFRAN ODT) 4 MG disintegrating tablet  Every 8 hours PRN        07/26/20 0126          *Please note:  FALLEN CRISOSTOMO was evaluated in Emergency Department on 07/26/2020 for the symptoms described in the history of present illness. She was evaluated in the context of the global COVID-19 pandemic, which necessitated consideration that the patient might be at risk for infection with the SARS-CoV-2 virus that causes COVID-19. Institutional protocols and algorithms that pertain to the evaluation of patients at risk for COVID-19 are in a state of rapid change based on information released by regulatory bodies including the CDC and federal and state  organizations. These policies and algorithms were followed during the patient's care in the ED.  Some ED evaluations and interventions may be delayed as a result of limited staffing during and the pandemic.*   Note:  This document was prepared using Dragon voice recognition software and may include unintentional dictation errors.   Irean Hong, MD 07/26/20 [phone]

## 2020-07-25 NOTE — ED Triage Notes (Addendum)
Patient ambulatory to triage with steady gait, without difficulty or distress noted; pt report since yesterday having pain to rt side intermittently radiating into rt flank and rt lower abd with some hematuria, fever & chills; tylenol taken approx 6pm

## 2020-07-25 NOTE — ED Notes (Signed)
Patient transported to CT 

## 2020-07-26 ENCOUNTER — Encounter: Payer: Self-pay | Admitting: Emergency Medicine

## 2020-07-26 ENCOUNTER — Inpatient Hospital Stay
Admission: EM | Admit: 2020-07-26 | Discharge: 2020-07-28 | DRG: 690 | Disposition: A | Discharge status: Home / Self Care | Payer: Medicaid Other | Attending: Internal Medicine | Admitting: Internal Medicine

## 2020-07-26 ENCOUNTER — Telehealth: Payer: Self-pay | Admitting: Emergency Medicine

## 2020-07-26 ENCOUNTER — Other Ambulatory Visit: Payer: Self-pay

## 2020-07-26 DIAGNOSIS — N12 Tubulo-interstitial nephritis, not specified as acute or chronic: Secondary | ICD-10-CM

## 2020-07-26 DIAGNOSIS — Z91018 Allergy to other foods: Secondary | ICD-10-CM

## 2020-07-26 DIAGNOSIS — Z20822 Contact with and (suspected) exposure to covid-19: Secondary | ICD-10-CM | POA: Diagnosis present

## 2020-07-26 DIAGNOSIS — R7881 Bacteremia: Secondary | ICD-10-CM

## 2020-07-26 DIAGNOSIS — Z79899 Other long term (current) drug therapy: Secondary | ICD-10-CM

## 2020-07-26 DIAGNOSIS — Z975 Presence of (intrauterine) contraceptive device: Secondary | ICD-10-CM

## 2020-07-26 DIAGNOSIS — B9620 Unspecified Escherichia coli [E. coli] as the cause of diseases classified elsewhere: Secondary | ICD-10-CM

## 2020-07-26 DIAGNOSIS — N10 Acute tubulo-interstitial nephritis: Principal | ICD-10-CM | POA: Diagnosis present

## 2020-07-26 DIAGNOSIS — Z87892 Personal history of anaphylaxis: Secondary | ICD-10-CM

## 2020-07-26 DIAGNOSIS — Z6839 Body mass index (BMI) 39.0-39.9, adult: Secondary | ICD-10-CM

## 2020-07-26 HISTORY — DX: Unspecified Escherichia coli (E. coli) as the cause of diseases classified elsewhere: N12

## 2020-07-26 HISTORY — DX: Unspecified Escherichia coli (E. coli) as the cause of diseases classified elsewhere: B96.20

## 2020-07-26 HISTORY — DX: Bacteremia: R78.81

## 2020-07-26 LAB — LACTIC ACID, PLASMA
Lactic Acid, Venous: 1 mmol/L (ref 0.5–1.9)
Lactic Acid, Venous: 1.3 mmol/L (ref 0.5–1.9)
Lactic Acid, Venous: 1.3 mmol/L (ref 0.5–1.9)
Lactic Acid, Venous: 1.7 mmol/L (ref 0.5–1.9)

## 2020-07-26 LAB — BASIC METABOLIC PANEL
Anion gap: 9 (ref 5–15)
BUN: 14 mg/dL (ref 6–20)
CO2: 23 mmol/L (ref 22–32)
Calcium: 8.7 mg/dL — ABNORMAL LOW (ref 8.9–10.3)
Chloride: 106 mmol/L (ref 98–111)
Creatinine, Ser: 0.86 mg/dL (ref 0.44–1.00)
GFR, Estimated: >60 mL/min (ref >60)
Glucose, Bld: 114 mg/dL — ABNORMAL HIGH (ref 70–99)
Potassium: 4.1 mmol/L (ref 3.5–5.1)
Sodium: 138 mmol/L (ref 135–145)

## 2020-07-26 LAB — CBC
HCT: 34.5 % — ABNORMAL LOW (ref 36.0–46.0)
Hemoglobin: 11.5 g/dL — ABNORMAL LOW (ref 12.0–15.0)
MCH: 29.9 pg (ref 26.0–34.0)
MCHC: 33.3 g/dL (ref 30.0–36.0)
MCV: 89.8 fL (ref 80.0–100.0)
Platelets: 191 10*3/uL (ref 150–400)
RBC: 3.84 MIL/uL — ABNORMAL LOW (ref 3.87–5.11)
RDW: 14.1 % (ref 11.5–15.5)
WBC: 7.6 10*3/uL (ref 4.0–10.5)
nRBC: 0 % (ref 0.0–0.2)

## 2020-07-26 LAB — RESPIRATORY PANEL BY RT PCR (FLU A&B, COVID)
Influenza A by PCR: NEGATIVE
Influenza B by PCR: NEGATIVE
SARS Coronavirus 2 by RT PCR: NEGATIVE

## 2020-07-26 LAB — HIV ANTIBODY (ROUTINE TESTING W REFLEX): HIV Screen 4th Generation wRfx: NONREACTIVE

## 2020-07-26 MED ORDER — SODIUM CHLORIDE 0.9 % IV SOLN
1.0000 g | Freq: Once | INTRAVENOUS | Status: AC
  Administered 2020-07-26: 1 g via INTRAVENOUS
  Filled 2020-07-26: qty 10

## 2020-07-26 MED ORDER — CEPHALEXIN 500 MG PO CAPS: 500.0000 mg | ORAL_CAPSULE | Freq: Three times a day (TID) | ORAL | 0 refills | Status: DC

## 2020-07-26 MED ORDER — ONDANSETRON HCL 4 MG PO TABS: 4.0000 mg | ORAL_TABLET | Freq: Four times a day (QID) | ORAL | Status: DC | PRN

## 2020-07-26 MED ORDER — ONDANSETRON HCL 4 MG/2ML IJ SOLN
4.0000 mg | Freq: Four times a day (QID) | INTRAMUSCULAR | Status: DC | PRN
  Administered 2020-07-27: 4 mg via INTRAVENOUS
  Filled 2020-07-26: qty 2

## 2020-07-26 MED ORDER — SODIUM CHLORIDE 0.9 % IV SOLN: INTRAVENOUS | Status: DC

## 2020-07-26 MED ORDER — ENOXAPARIN SODIUM 60 MG/0.6ML ~~LOC~~ SOLN
0.5000 mg/kg | SUBCUTANEOUS | Status: DC
  Administered 2020-07-26 – 2020-07-27 (×2): 50 mg via SUBCUTANEOUS
  Filled 2020-07-26 (×3): qty 0.6

## 2020-07-26 MED ORDER — SODIUM CHLORIDE 0.9 % IV SOLN
2.0000 g | INTRAVENOUS | Status: DC
  Administered 2020-07-26 – 2020-07-27 (×2): 2 g via INTRAVENOUS
  Filled 2020-07-26: qty 20
  Filled 2020-07-26: qty 2
  Filled 2020-07-26: qty 20

## 2020-07-26 MED ORDER — IMMUNE SUPPORT PO CHEW: 1.0000 | CHEWABLE_TABLET | Freq: Every day | ORAL | Status: DC

## 2020-07-26 MED ORDER — ONDANSETRON 4 MG PO TBDP: 4.0000 mg | ORAL_TABLET | Freq: Three times a day (TID) | ORAL | 0 refills | Status: DC | PRN

## 2020-07-26 MED ORDER — ACETAMINOPHEN 650 MG RE SUPP: 650.0000 mg | Freq: Four times a day (QID) | RECTAL | Status: DC | PRN

## 2020-07-26 MED ORDER — SODIUM CHLORIDE 0.9 % IV BOLUS
1000.0000 mL | Freq: Once | INTRAVENOUS | Status: AC
  Administered 2020-07-26: 1000 mL via INTRAVENOUS

## 2020-07-26 MED ORDER — ACETAMINOPHEN 325 MG PO TABS
650.0000 mg | ORAL_TABLET | Freq: Four times a day (QID) | ORAL | Status: DC | PRN
  Administered 2020-07-26 – 2020-07-27 (×4): 650 mg via ORAL
  Filled 2020-07-26 (×4): qty 2

## 2020-07-26 MED ORDER — ENOXAPARIN SODIUM 40 MG/0.4ML ~~LOC~~ SOLN: 40.0000 mg | SUBCUTANEOUS | Status: DC

## 2020-07-26 NOTE — H&P (Addendum)
History and Physical    Erica Le GGY:[id number] DOB: May 29, 1996 DOA: 07/26/2020  PCP: Patient, No Pcp Per   Patient coming from: Home  I have personally briefly reviewed patient's old medical records in [facility]  Chief Complaint: Right flank pain  HPI: Erica Le is a 24 y.o. female with no significant past medical history who presented to the ER for evaluation of right-sided flank pain for 4 days associated with subjective fever and chills at home.  She rated her right flank pain an 8 x 10 in intensity at its worst, it was nonradiating and she denied having any relieving or aggravating factors.  Right flank pain was associated with nausea and hematuria which has resolved.  She denies having a history of kidney stones.  She denies having any urinary frequency, nocturia or dysuria. She denies having any chest pain, no shortness of breath, no nausea, no vomiting, no dizziness, no lightheadedness. Patient was seen in the emergency room and in the hours of the morning and was discharged home after completion of IV antibiotics and IV fluids.  She was given a prescription for Keflex and Zofran ODT and advised to return to the ER as needed.  Patient's blood cultures are positive for E. coli and Enterobacter and so patient was called back to the hospital to be admitted for IV antibiotic therapy. Labs show sodium 138, potassium 4.1, chloride 106, bicarb 23, glucose 114, BUN 14, creatinine 0.86, calcium 8.7, lactic acid 1.0, white count 7.6, hemoglobin 11.5, hematocrit 34.5, MCV 89.8, RDW 14.1, platelet count 191 Blood cultures yielded Enterobacterales and E. Coli CT renal stone shows no hydronephrosis.  No radiopaque kidney stones.There is several subtle findings involving the right kidney that raise suspicion for underlying right-sided pyelonephritis. Correlation with urinalysis is recommended.   ED Course: Patient is a 24 year old female who was called back to the ER because of positive  blood cultures for admission to receive IV antibiotic therapy.  Patient was seen earlier in the day for acute pyelonephritis and was discharged home on oral antibiotic therapy.  Blood cultures yielded E. coli and Enterobacterales.  She will be admitted to the hospital for IV antibiotic therapy  Review of Systems: As per HPI otherwise 10 point review of systems negative.    Past Medical History:  Diagnosis Date  . Asthma     Past Surgical History:  Procedure Laterality Date  . TONSILLECTOMY       reports that she has never smoked. She has never used smokeless tobacco. She reports current alcohol use. She reports that she does not use drugs.  Allergies  Allergen Reactions  . Apple Shortness Of Breath, Itching and Swelling  . Other Anaphylaxis    Pears, plums, peaches    History reviewed. No pertinent family history.   Prior to Admission medications   Medication Sig Start Date End Date Taking? Authorizing Provider  acyclovir (ZOVIRAX) 800 MG tablet Take 1 tablet (800 mg total) by mouth daily. 05/08/20 05/09/21  Larene Pickett, FNP  cephALEXin (KEFLEX) 500 MG capsule Take 1 capsule (500 mg total) by mouth 3 (three) times daily. 07/26/20   Irean Hong, MD  Levonorgestrel [facility]) 19.5 MG IUD by Intrauterine route.    [provider]  ondansetron (ZOFRAN ODT) 4 MG disintegrating tablet Take 1 tablet (4 mg total) by mouth every 8 (eight) hours as needed for nausea or vomiting. 07/26/20   Irean Hong, MD    Physical Exam: Vitals:   07/26/20  1242 07/26/20 1257 07/26/20 1258 07/26/20 1439  BP:  (!) 145/76    Pulse:  90    Resp:  20    Temp:  98.9 F (37.2 C)  99.6 F (37.6 C)  TempSrc:  Oral  Oral  SpO2:  98%    Weight: 101.6 kg  101.6 kg   Height: 5\' 3"  (1.6 m)  5\' 3"  (1.6 m)      Vitals:   07/26/20 1242 07/26/20 1257 07/26/20 1258 07/26/20 1439  BP:  (!) 145/76    Pulse:  90    Resp:  20    Temp:  98.9 F (37.2 C)  99.6 F (37.6 C)  TempSrc:  Oral  Oral    SpO2:  98%    Weight: 101.6 kg  101.6 kg   Height: 5\' 3"  (1.6 m)  5\' 3"  (1.6 m)     Constitutional: NAD, alert and oriented x 3 Eyes: PERRL, lids and conjunctivae normal ENMT: Mucous membranes are moist.  Neck: normal, supple, no masses, no thyromegaly Respiratory: clear to auscultation bilaterally, no wheezing, no crackles. Normal respiratory effort. No accessory muscle use.  Cardiovascular: Regular rate and rhythm, no murmurs / rubs / gallops. No extremity edema. 2+ pedal pulses. No carotid bruits.  Abdomen: Right CVA tenderness, no masses palpated. No hepatosplenomegaly. Bowel sounds positive.  Musculoskeletal: no clubbing / cyanosis. No joint deformity upper and lower extremities.  Skin: no rashes, lesions, ulcers.  Neurologic: No gross focal neurologic deficit. Psychiatric: Normal mood and affect.   Labs on Admission: I have personally reviewed following labs and imaging studies  CBC: Recent Labs  Lab 07/25/20 2014 07/26/20 1257  WBC 8.9 7.6  NEUTROABS 7.5  --   HGB 11.4* 11.5*  HCT 34.3* 34.5*  MCV 90.3 89.8  PLT 190 191   Basic Metabolic Panel: Recent Labs  Lab 07/25/20 2014 07/26/20 1257  NA 139 138  K 3.9 4.1  CL 108 106  CO2 22 23  GLUCOSE 120* 114*  BUN 16 14  CREATININE 1.02* 0.86  CALCIUM 8.4* 8.7*   GFR: Estimated Creatinine Clearance: 114.8 mL/min (by C-G formula based on SCr of 0.86 mg/dL). Liver Function Tests: Recent Labs  Lab 07/25/20 2014  AST 31  ALT 25  ALKPHOS 70  BILITOT 0.8  PROT 7.3  ALBUMIN 3.7   No results for input(s): LIPASE, AMYLASE in the last 168 hours. No results for input(s): AMMONIA in the last 168 hours. Coagulation Profile: No results for input(s): INR, PROTIME in the last 168 hours. Cardiac Enzymes: No results for input(s): CKTOTAL, CKMB, CKMBINDEX, TROPONINI in the last 168 hours. BNP (last 3 results) No results for input(s): PROBNP in the last 8760 hours. HbA1C: No results for input(s): HGBA1C in the last  72 hours. CBG: No results for input(s): GLUCAP in the last 168 hours. Lipid Profile: No results for input(s): CHOL, HDL, LDLCALC, TRIG, CHOLHDL, LDLDIRECT in the last 72 hours. Thyroid Function Tests: No results for input(s): TSH, T4TOTAL, FREET4, T3FREE, THYROIDAB in the last 72 hours. Anemia Panel: No results for input(s): VITAMINB12, FOLATE, FERRITIN, TIBC, IRON, RETICCTPCT in the last 72 hours. Urine analysis:    Component Value Date/Time   COLORURINE YELLOW (A) 07/25/2020 2014   APPEARANCEUR HAZY (A) 07/25/2020 2014   LABSPEC 1.026 07/25/2020 2014   PHURINE 5.0 07/25/2020 2014   GLUCOSEU NEGATIVE 07/25/2020 2014   HGBUR SMALL (A) 07/25/2020 2014   BILIRUBINUR NEGATIVE 07/25/2020 2014   KETONESUR NEGATIVE 07/25/2020 2014   PROTEINUR  30 (A) 07/25/2020 2014   NITRITE NEGATIVE 07/25/2020 2014   LEUKOCYTESUR MODERATE (A) 07/25/2020 2014    Radiological Exams on Admission: CT Renal Stone Study  Result Date: 07/26/2020 CLINICAL DATA:  Right-sided flank pain EXAM: CT ABDOMEN AND PELVIS WITHOUT CONTRAST TECHNIQUE: Multidetector CT imaging of the abdomen and pelvis was performed following the standard protocol without IV contrast. COMPARISON:  None. FINDINGS: Lower chest: The lung bases are clear. The heart size is normal. Hepatobiliary: The liver is normal. Normal gallbladder.There is no biliary ductal dilation. Pancreas: Normal contours without ductal dilatation. No peripancreatic fluid collection. Spleen: Unremarkable. Adrenals/Urinary Tract: --Adrenal glands: Unremarkable. --Right kidney/ureter: The right kidney appears somewhat enlarged when compared to the left. There is mild adjacent fat stranding. There is no hydronephrosis. There is no radiopaque obstructing kidney stone. --Left kidney/ureter: No hydronephrosis or radiopaque kidney stones. --Urinary bladder: Unremarkable. Stomach/Bowel: --Stomach/Duodenum: No hiatal hernia or other gastric abnormality. Normal duodenal course and  caliber. --Small bowel: Unremarkable. --Colon: Unremarkable. --Appendix: Normal. Vascular/Lymphatic: Normal course and caliber of the major abdominal vessels. --No retroperitoneal lymphadenopathy. --No mesenteric lymphadenopathy. --No pelvic or inguinal lymphadenopathy. Reproductive: There is an IUD in place. Other: No ascites or free air. The abdominal wall is normal. Musculoskeletal. No acute displaced fractures. IMPRESSION: 1. No hydronephrosis.  No radiopaque kidney stones. 2. There is several subtle findings involving the right kidney that raise suspicion for underlying right-sided pyelonephritis. Correlation with urinalysis is recommended. 3. Normal appendix. Electronically Signed   By: Katherine Mantle M.D.   On: 07/26/2020 00:10    EKG: Independently reviewed.   Assessment/Plan Active Problems:   Pyelonephritis due to Escherichia coli   E coli bacteremia    Pyelonephritis due to E. Coli (POA) Patient presents for evaluation of right-sided flank pain associated with fever and chills and imaging shows right sided pyelonephritis Blood cultures are positive for E. Coli We will admit patient to the hospital start on Rocephin 2 g IV daily Supportive care with antiemetics and pain control  DVT prophylaxis: Lovenox Code Status: Full code Family Communication: Greater than 50% of time was spent discussing plan of care with patient at the bedside.  She verbalizes understanding and agrees with the plan. Disposition Plan: Back to previous home environment Consults called: None    Justice Milliron MD [facility]     07/26/2020, 3:27 PM

## 2020-07-26 NOTE — Telephone Encounter (Signed)
Received report of positive blood culture from lab. Reviewed by Fanny Bien and he recommends patient return.  I called the patient and informed her of bacteria in blood and need to return.  She will come back now.

## 2020-07-26 NOTE — Discharge Instructions (Signed)
Take antibiotic as prescribed (Keflex 500 mg 3 times daily x7 days). You may take Zofran as needed for nausea. Return to the ER for worsening symptoms, persistent vomiting, fever or other concerns.

## 2020-07-26 NOTE — Progress Notes (Signed)
PHARMACIST - PHYSICIAN COMMUNICATION  CONCERNING:  Enoxaparin (Lovenox) for DVT Prophylaxis    RECOMMENDATION: Patient was prescribed enoxaprin 40mg  q24 hours for VTE prophylaxis.   Filed Weights   07/26/20 1242 07/26/20 1258  Weight: 101.6 kg (224 lb) 101.6 kg (224 lb)    Body mass index is 39.68 kg/m.  Estimated Creatinine Clearance: 114.8 mL/min (by C-G formula based on SCr of 0.86 mg/dL).   Based on [facility] policy patient is candidate for enoxaparin 0.5mg /kg TBW SQ every 24 hours based on BMI being >30.   DESCRIPTION: Pharmacy has adjusted enoxaparin dose per [facility] policy.  Patient is now receiving enoxaparin 50 mg every 24 hours    [facility], PharmD Clinical Pharmacist  07/26/2020 4:26 PM

## 2020-07-26 NOTE — Progress Notes (Signed)
   07/26/20 1709  Assess: MEWS Score  Temp 98.9 F (37.2 C)  BP 133/78  Pulse Rate (!) 101  Resp (!) 22  Level of Consciousness Alert  SpO2 100 %  O2 Device Room Air  Assess: MEWS Score  MEWS Temp 0  MEWS Systolic 0  MEWS Pulse 1  MEWS RR 1  MEWS LOC 0  MEWS Score 2  MEWS Score Color Yellow  Assess: if the MEWS score is Yellow or Red  Were vital signs taken at a resting state? Yes  Focused Assessment No change from prior assessment  Early Detection of Sepsis Score *See Row Information* Low  MEWS guidelines implemented *See Row Information* Yes  Treat  MEWS Interventions Escalated (See documentation below)  Take Vital Signs  Increase Vital Sign Frequency  Yellow: Q 2hr X 2 then Q 4hr X 2, if remains yellow, continue Q 4hrs  Escalate  MEWS: Escalate Yellow: discuss with charge nurse/RN and consider discussing with provider and RRT  Notify: Charge Nurse/RN  Name of Charge Nurse/RN Notified Annabella,RN  Date Charge Nurse/RN Notified 07/26/20  Time Charge Nurse/RN Notified 1713

## 2020-07-26 NOTE — ED Provider Notes (Signed)
Reviewed the patient's blood culture to this point, demonstrating E. coli.  Sensitivities not yet known.  Given the patient's presentation and concern by previous provider for pyelonephritis with associated risk and now positive culture with gram-negative.  I am concerned about bacteremia.  Discussed with nurse navigator Viviano Simas, she will contact the patient and asked them to return for further evaluation and possible hospitalization.  Is unclear the exact bacteria at this time, but certainly can't exclude resistant infection such as ESBL is on the differential.   Sharyn Creamer, MD 07/26/20 1211

## 2020-07-26 NOTE — ED Notes (Signed)
ED Provider at bedside. 

## 2020-07-26 NOTE — ED Notes (Signed)
Back from ct.

## 2020-07-26 NOTE — ED Notes (Signed)
Discharge intructions reviewed with patient. Pt AOx4.

## 2020-07-26 NOTE — Progress Notes (Signed)
Ch visited Pt in response to OR for AD. Pt's dad [facility] bedside as well. Ch asked if Pt was wanting to complete AD; Pt said she wanted information on AD. Ch gave AD education. Pt wanted to hold onto AD packet and look it over. Ch checked in on Pt. Pt sad and anxious while explaining infection in the kidney and bacterial spread. Pt requested prayer, Ch prayed with her. They were grateful for visit.

## 2020-07-26 NOTE — ED Provider Notes (Signed)
[facility] Emergency Department Provider Note   ____________________________________________   First MD Initiated Contact with Patient 07/26/20 1419     (approximate)  I have reviewed the triage vital signs and the nursing notes.   HISTORY  Chief Complaint Abnormal Lab   HPI Erica Le is a 24 y.o. female who was seen yesterday and had a CT scan that showed what appeared to be pyelonephritis.  Blood culture came back showing E. coli.  Patient with UTI with multiple WBCs.  Patient initially was feeling well on Keflex but is now feeling achy all over and having chills.  He still has a deep achy pain in the flank over the affected kidney.         Past Medical History:  Diagnosis Date  . Asthma     Patient Active Problem List   Diagnosis Date Noted  . Exposure to hepatitis C 10/12/2019    Past Surgical History:  Procedure Laterality Date  . TONSILLECTOMY      Prior to Admission medications   Medication Sig Start Date End Date Taking? Authorizing Provider  acyclovir (ZOVIRAX) 800 MG tablet Take 1 tablet (800 mg total) by mouth daily. 05/08/20 05/09/21  Larene Pickett, FNP  cephALEXin (KEFLEX) 500 MG capsule Take 1 capsule (500 mg total) by mouth 3 (three) times daily. 07/26/20   Irean Hong, MD  Levonorgestrel [employer]) 19.5 MG IUD by Intrauterine route.    [provider]  ondansetron (ZOFRAN ODT) 4 MG disintegrating tablet Take 1 tablet (4 mg total) by mouth every 8 (eight) hours as needed for nausea or vomiting. 07/26/20   Irean Hong, MD    Allergies Apple and Other  History reviewed. No pertinent family history.  Social History Social History   Tobacco Use  . Smoking status: Never Smoker  . Smokeless tobacco: Never Used  Vaping Use  . Vaping Use: Some days  Substance Use Topics  . Alcohol use: Yes    Comment: 2x/mo  . Drug use: Never    Review of Systems  Constitutional: No fever/chills feels hot and cold  chills Eyes: No visual changes. ENT: No sore throat. Cardiovascular: Denies chest pain. Respiratory: Denies shortness of breath. Gastrointestinal: No abdominal pain.   nausea, no vomiting.  No diarrhea.  No constipation. Genitourinary: Negative for dysuria. Musculoskeletal: Right-sided flank pain Skin: Negative for rash. Neurological: Negative for headaches, focal weakness  ____________________________________________   PHYSICAL EXAM:  VITAL SIGNS: ED Triage Vitals  Enc Vitals Group     BP 07/26/20 1241 (!) 145/76     Pulse Rate 07/26/20 1241 90     Resp 07/26/20 1241 20     Temp 07/26/20 1241 98.9 F (37.2 C)     Temp Source 07/26/20 1241 Oral     SpO2 07/26/20 1241 98 %     Weight 07/26/20 1242 224 lb (101.6 kg)     Height 07/26/20 1242 5\' 3"  (1.6 m)     Head Circumference --      Peak Flow --      Pain Score 07/26/20 1258 6     Pain Loc --      Pain Edu? --      Excl. in GC? --     Constitutional: Alert and oriented.  Looks uncomfortable and is covered by blanket Eyes: Conjunctivae are normal.  Head: Atraumatic. Nose: No congestion/rhinnorhea. Mouth/Throat: Mucous membranes are moist.  Oropharynx non-erythematous. Neck: No stridor.   Cardiovascular: Normal rate, regular  rhythm. Grossly normal heart sounds.  Good peripheral circulation. Respiratory: Normal respiratory effort.  No retractions. Lungs CTAB. Gastrointestinal: Soft and nontender. No distention. No abdominal bruits.  Some right CVA tenderness. Musculoskeletal: No lower extremity tenderness nor edema.   Neurologic:  Normal speech and language. No gross focal neurologic deficits are appreciated. . Skin:  Skin is warm, dry and intact. No rash noted.   ____________________________________________   LABS (all labs ordered are listed, but only abnormal results are displayed)  Labs Reviewed  CBC - Abnormal; Notable for the following components:      Result Value   RBC 3.84 (*)    Hemoglobin 11.5 (*)     HCT 34.5 (*)    All other components within normal limits  BASIC METABOLIC PANEL - Abnormal; Notable for the following components:   Glucose, Bld 114 (*)    Calcium 8.7 (*)    All other components within normal limits  CULTURE, BLOOD (ROUTINE X 2)  CULTURE, BLOOD (ROUTINE X 2)  URINE CULTURE  LACTIC ACID, PLASMA  LACTIC ACID, PLASMA  LACTIC ACID, PLASMA   ____________________________________________  EKG   ____________________________________________  RADIOLOGY Jill Poling, personally viewed and evaluated these images (plain radiographs) as part of my medical decision making, as well as reviewing the written report by the radiologist.  ED MD interpretation  Official radiology report(s): CT Renal Stone Study  Result Date: 07/26/2020 CLINICAL DATA:  Right-sided flank pain EXAM: CT ABDOMEN AND PELVIS WITHOUT CONTRAST TECHNIQUE: Multidetector CT imaging of the abdomen and pelvis was performed following the standard protocol without IV contrast. COMPARISON:  None. FINDINGS: Lower chest: The lung bases are clear. The heart size is normal. Hepatobiliary: The liver is normal. Normal gallbladder.There is no biliary ductal dilation. Pancreas: Normal contours without ductal dilatation. No peripancreatic fluid collection. Spleen: Unremarkable. Adrenals/Urinary Tract: --Adrenal glands: Unremarkable. --Right kidney/ureter: The right kidney appears somewhat enlarged when compared to the left. There is mild adjacent fat stranding. There is no hydronephrosis. There is no radiopaque obstructing kidney stone. --Left kidney/ureter: No hydronephrosis or radiopaque kidney stones. --Urinary bladder: Unremarkable. Stomach/Bowel: --Stomach/Duodenum: No hiatal hernia or other gastric abnormality. Normal duodenal course and caliber. --Small bowel: Unremarkable. --Colon: Unremarkable. --Appendix: Normal. Vascular/Lymphatic: Normal course and caliber of the major abdominal vessels. --No retroperitoneal  lymphadenopathy. --No mesenteric lymphadenopathy. --No pelvic or inguinal lymphadenopathy. Reproductive: There is an IUD in place. Other: No ascites or free air. The abdominal wall is normal. Musculoskeletal. No acute displaced fractures. IMPRESSION: 1. No hydronephrosis.  No radiopaque kidney stones. 2. There is several subtle findings involving the right kidney that raise suspicion for underlying right-sided pyelonephritis. Correlation with urinalysis is recommended. 3. Normal appendix. Electronically Signed   By: Katherine Mantle M.D.   On: 07/26/2020 00:10    ____________________________________________   PROCEDURES  Procedure(s) performed (including Critical Care):  Procedures   ____________________________________________   INITIAL IMPRESSION / ASSESSMENT AND PLAN / ED COURSE  Patient was called back because of positive blood culture.  She initially was feeling well except for some deep achy pain in the right CVA area but as time passed she became achy and felt hot and cold her lactic acid has come back somewhat elevated from last night went from 1.3-1.7 now and her temperature is gone up slightly to 99.7.  It appears that she is indeed becoming bacteremic and/or septic although she is not septic at this point.  I will discuss admission with the hospitalist.  ____________________________________________   FINAL CLINICAL IMPRESSION(S) / ED DIAGNOSES  Final diagnoses:  Gram-negative bacteremia  Pyelonephritis     ED Discharge Orders    None      *Please note:  SWEET JARVIS was evaluated in Emergency Department on 07/26/2020 for the symptoms described in the history of present illness. She was evaluated in the context of the global COVID-19 pandemic, which necessitated consideration that the patient might be at risk for infection with the SARS-CoV-2 virus that causes COVID-19. Institutional protocols and algorithms that pertain to the evaluation of patients  at risk for COVID-19 are in a state of rapid change based on information released by regulatory bodies including the CDC and federal and state organizations. These policies and algorithms were followed during the patient's care in the ED.  Some ED evaluations and interventions may be delayed as a result of limited staffing during and the pandemic.*   Note:  This document was prepared using Dragon voice recognition software and may include unintentional dictation errors.    Arnaldo Natal, MD 07/26/20 [phone]

## 2020-07-26 NOTE — ED Triage Notes (Signed)
Pt reports received a call from the nurse navigator to return to the ED for abnormal labs. Pt reports still with some pain to her right lower back

## 2020-07-27 ENCOUNTER — Encounter: Payer: Self-pay | Admitting: Internal Medicine

## 2020-07-27 LAB — BLOOD CULTURE ID PANEL (REFLEXED) - BCID2

## 2020-07-27 LAB — CBC
HCT: 30.3 % — ABNORMAL LOW (ref 36.0–46.0)
Hemoglobin: 10.1 g/dL — ABNORMAL LOW (ref 12.0–15.0)
MCH: 29.7 pg (ref 26.0–34.0)
MCHC: 33.3 g/dL (ref 30.0–36.0)
MCV: 89.1 fL (ref 80.0–100.0)
Platelets: 161 10*3/uL (ref 150–400)
RBC: 3.4 MIL/uL — ABNORMAL LOW (ref 3.87–5.11)
RDW: 14 % (ref 11.5–15.5)
WBC: 5.8 10*3/uL (ref 4.0–10.5)
nRBC: 0 % (ref 0.0–0.2)

## 2020-07-27 LAB — BASIC METABOLIC PANEL
Anion gap: 6 (ref 5–15)
BUN: 9 mg/dL (ref 6–20)
CO2: 24 mmol/L (ref 22–32)
Calcium: 8.2 mg/dL — ABNORMAL LOW (ref 8.9–10.3)
Chloride: 107 mmol/L (ref 98–111)
Creatinine, Ser: 0.98 mg/dL (ref 0.44–1.00)
GFR, Estimated: >60 mL/min (ref >60)
Glucose, Bld: 102 mg/dL — ABNORMAL HIGH (ref 70–99)
Potassium: 4 mmol/L (ref 3.5–5.1)
Sodium: 137 mmol/L (ref 135–145)

## 2020-07-27 LAB — URINE CULTURE: Culture: <10000 — AB

## 2020-07-27 MED ORDER — PROMETHAZINE HCL 25 MG/ML IJ SOLN
12.5000 mg | Freq: Four times a day (QID) | INTRAMUSCULAR | Status: DC | PRN
  Administered 2020-07-27: 12.5 mg via INTRAVENOUS
  Filled 2020-07-27: qty 1

## 2020-07-27 MED ORDER — LACTATED RINGERS IV BOLUS
1000.0000 mL | Freq: Once | INTRAVENOUS | Status: AC
  Administered 2020-07-27: 1000 mL via INTRAVENOUS

## 2020-07-27 MED ORDER — OXYCODONE HCL 5 MG PO TABS: 5.0000 mg | ORAL_TABLET | Freq: Three times a day (TID) | ORAL | Status: DC | PRN

## 2020-07-27 NOTE — Progress Notes (Addendum)
Progress Note    Erica Le  ZOX:[id number] DOB: [birth year]  DOA: 07/26/2020 PCP: Patient, No Pcp Per      Brief Narrative:    Medical records reviewed and are as summarized below:  Erica Le is a 24 y.o. female with medical history significant for morbid obesity, genital herpes (HSV 1) in August 2021, presented to the emergency room for evaluation of right-sided flank pain, fever or chills.  Symptoms had been going on for about 4 days prior to presentation.  She also reports have been going on a few days prior to admission.  She was evaluated and discharged from the emergency room and she was sent home on antibiotics.  However, patient was called back to the hospital because initial blood culture that was done on 07/25/2020 showed E. Coli.  She was admitted to the hospital for E. coli bacteremia secondary to E. coli acute pyelonephritis.  She was treated with IV Rocephin.      Assessment/Plan:   Principal Problem:   E coli bacteremia Active Problems:   Pyelonephritis due to Escherichia coli   Body mass index is 39.68 kg/m.  (Morbid obesity)   E. coli bacteremia secondary to acute right pyelonephritis: Continue IV Rocephin.  Blood culture susceptibility report is pending.  Analgesics as needed for pain.  Discontinue IV fluids.     Diet Order            Diet regular Room service appropriate? Yes; Fluid consistency: Thin  Diet effective now                    Consultants:  None  Procedures:  None    Medications:   . enoxaparin (LOVENOX) injection  0.5 mg/kg Subcutaneous Q24H   Continuous Infusions: . cefTRIAXone (ROCEPHIN)  IV 2 g (07/26/20 2102)     Anti-infectives (From admission, onward)   Start     Dose/Rate Route Frequency Ordered Stop   07/26/20 2200  cefTRIAXone (ROCEPHIN) 2 g in sodium chloride 0.9 % 100 mL IVPB        2 g 200 mL/hr over 30 Minutes Intravenous Every 24 hours 07/26/20 1622     07/26/20 1445  cefTRIAXone  (ROCEPHIN) 1 g in sodium chloride 0.9 % 100 mL IVPB        1 g 200 mL/hr over 30 Minutes Intravenous  Once 07/26/20 1435 07/26/20 1522             Family Communication/Anticipated D/C date and plan/Code Status   DVT prophylaxis:      Code Status: Full Code  Family Communication:  Disposition Plan:    Status is: Inpatient  Remains inpatient appropriate because:IV treatments appropriate due to intensity of illness or inability to take PO   Dispo: The patient is from: Home              Anticipated d/c is to: Home              Anticipated d/c date is: 1 day              Patient currently is not medically stable to d/c.           Subjective:   C/o right flank pain.  Hematuria has resolved.  No nausea or vomiting.  Objective:    Vitals:   07/26/20 2327 07/27/20 0355 07/27/20 0805 07/27/20 1216  BP:  109/61 127/70 125/76  Pulse:  90 88 96  Resp:  18 17  16  Temp:  98.5 F (36.9 C) 99.3 F (37.4 C) 98.6 F (37 C)  TempSrc: Oral Oral Oral   SpO2:  100% 100% 97%  Weight:      Height:       No data found.   Intake/Output Summary (Last 24 hours) at 07/27/2020 1335 Last data filed at 07/26/2020 2025 Gross per 24 hour  Intake 1166.35 ml  Output 400 ml  Net 766.35 ml   Filed Weights   07/26/20 1242 07/26/20 1258  Weight: 101.6 kg 101.6 kg    Exam:  GEN: NAD SKIN: No rash EYES: EOMI ENT: MMM CV: RRR PULM: CTA B ABD: soft, obese, mild suprapubic tenderness, +BS CNS: AAO x 3, non focal EXT: No edema or tenderness GU: Mild right CVA tenderness   Data Reviewed:   I have personally reviewed following labs and imaging studies:  Labs: Labs show the following:   Basic Metabolic Panel: Recent Labs  Lab 07/25/20 2014 07/25/20 2014 07/26/20 1257 07/27/20 0540  NA 139  --  138 137  K 3.9   < > 4.1 4.0  CL 108  --  106 107  CO2 22  --  23 24  GLUCOSE 120*  --  114* 102*  BUN 16  --  14 9  CREATININE 1.02*  --  0.86 0.98  CALCIUM 8.4*   --  8.7* 8.2*   < > = values in this interval not displayed.   GFR Estimated Creatinine Clearance: 100.8 mL/min (by C-G formula based on SCr of 0.98 mg/dL). Liver Function Tests: Recent Labs  Lab 07/25/20 2014  AST 31  ALT 25  ALKPHOS 70  BILITOT 0.8  PROT 7.3  ALBUMIN 3.7   No results for input(s): LIPASE, AMYLASE in the last 168 hours. No results for input(s): AMMONIA in the last 168 hours. Coagulation profile No results for input(s): INR, PROTIME in the last 168 hours.  CBC: Recent Labs  Lab 07/25/20 2014 07/26/20 1257 07/27/20 0540  WBC 8.9 7.6 5.8  NEUTROABS 7.5  --   --   HGB 11.4* 11.5* 10.1*  HCT 34.3* 34.5* 30.3*  MCV 90.3 89.8 89.1  PLT 190 191 161   Cardiac Enzymes: No results for input(s): CKTOTAL, CKMB, CKMBINDEX, TROPONINI in the last 168 hours. BNP (last 3 results) No results for input(s): PROBNP in the last 8760 hours. CBG: No results for input(s): GLUCAP in the last 168 hours. D-Dimer: No results for input(s): DDIMER in the last 72 hours. Hgb A1c: No results for input(s): HGBA1C in the last 72 hours. Lipid Profile: No results for input(s): CHOL, HDL, LDLCALC, TRIG, CHOLHDL, LDLDIRECT in the last 72 hours. Thyroid function studies: No results for input(s): TSH, T4TOTAL, T3FREE, THYROIDAB in the last 72 hours.  Invalid input(s): FREET3 Anemia work up: No results for input(s): VITAMINB12, FOLATE, FERRITIN, TIBC, IRON, RETICCTPCT in the last 72 hours. Sepsis Labs: Recent Labs  Lab 07/25/20 2014 07/26/20 0006 07/26/20 1257 07/26/20 1442 07/26/20 1713 07/27/20 0540  WBC 8.9  --  7.6  --   --  5.8  LATICACIDVEN  --  1.3 1.7 1.0 1.3  --     Microbiology Recent Results (from the past 240 hour(s))  Culture, blood (routine x 2)     Status: Abnormal (Preliminary result)   Collection Time: 07/25/20 12:06 AM   Specimen: BLOOD  Result Value Ref Range Status   Specimen Description   Final    BLOOD LEFT ANTECUBITAL Performed at [facility], [address], [place], [place] [address]    Special Requests   Final    BOTTLES DRAWN AEROBIC AND ANAEROBIC Blood Culture adequate volume Performed at [facility], [address] [place], [place] [address]    Culture  Setup Time   Final    Organism ID to follow IN BOTH AEROBIC AND ANAEROBIC BOTTLES GRAM NEGATIVE RODS CRITICAL RESULT CALLED TO, READ BACK BY AND VERIFIED WITH: Charise Carwin 07/26/20 1134 KLW Performed at [facility], [address], [place], [place] [address]    Culture (A)  Final    ESCHERICHIA COLI SUSCEPTIBILITIES TO FOLLOW Performed at [facility], [address] [place], [place] [address]    Report Status PENDING  Incomplete  Blood Culture ID Panel (Reflexed)     Status: Abnormal   Collection Time: 07/25/20 12:06 AM  Result Value Ref Range Status   Enterococcus faecalis NOT DETECTED NOT DETECTED Final   Enterococcus Faecium NOT DETECTED NOT DETECTED Final   Listeria monocytogenes NOT DETECTED NOT DETECTED Final   Staphylococcus species NOT DETECTED NOT DETECTED Final   Staphylococcus aureus (BCID) NOT DETECTED NOT DETECTED Final   Staphylococcus epidermidis NOT DETECTED NOT DETECTED Final   Staphylococcus lugdunensis NOT DETECTED NOT DETECTED Final   Streptococcus species NOT DETECTED NOT DETECTED Final   Streptococcus agalactiae NOT DETECTED NOT DETECTED Final   Streptococcus pneumoniae NOT DETECTED NOT DETECTED Final   Streptococcus pyogenes NOT DETECTED NOT DETECTED Final   A.calcoaceticus-baumannii NOT DETECTED NOT DETECTED Final   Bacteroides fragilis NOT DETECTED NOT DETECTED Final   Enterobacterales DETECTED (A) NOT DETECTED Final    Comment: Enterobacterales represent a large order of gram negative bacteria, not a single organism. CRITICAL RESULT CALLED TO, READ BACK BY AND VERIFIED WITH: STEPHANIE GANNON 07/26/20 1134 KLW    Enterobacter cloacae complex NOT DETECTED NOT DETECTED Final   Escherichia coli  DETECTED (A) NOT DETECTED Final    Comment: CRITICAL RESULT CALLED TO, READ BACK BY AND VERIFIED WITH: STEPHANIE GANNON 07/26/20 1134 KLW    Klebsiella aerogenes NOT DETECTED NOT DETECTED Final   Klebsiella oxytoca NOT DETECTED NOT DETECTED Final   Klebsiella pneumoniae NOT DETECTED NOT DETECTED Final   Proteus species NOT DETECTED NOT DETECTED Final   Salmonella species NOT DETECTED NOT DETECTED Final   Serratia marcescens NOT DETECTED NOT DETECTED Final   Haemophilus influenzae NOT DETECTED NOT DETECTED Final   Neisseria meningitidis NOT DETECTED NOT DETECTED Final   Pseudomonas aeruginosa NOT DETECTED NOT DETECTED Final   Stenotrophomonas maltophilia NOT DETECTED NOT DETECTED Final   Candida albicans NOT DETECTED NOT DETECTED Final   Candida auris NOT DETECTED NOT DETECTED Final   Candida glabrata NOT DETECTED NOT DETECTED Final   Candida krusei NOT DETECTED NOT DETECTED Final   Candida parapsilosis NOT DETECTED NOT DETECTED Final   Candida tropicalis NOT DETECTED NOT DETECTED Final   Cryptococcus neoformans/gattii NOT DETECTED NOT DETECTED Final   CTX-M ESBL NOT DETECTED NOT DETECTED Final   Carbapenem resistance IMP NOT DETECTED NOT DETECTED Final   Carbapenem resistance KPC NOT DETECTED NOT DETECTED Final   Carbapenem resistance NDM NOT DETECTED NOT DETECTED Final   Carbapenem resist OXA 48 LIKE NOT DETECTED NOT DETECTED Final   Carbapenem resistance VIM NOT DETECTED NOT DETECTED Final    Comment: Performed at [facility], [address] [place], [place] [address]  Culture, blood (routine x 2)     Status: None (Preliminary result)   Collection  Time: 07/25/20 12:07 AM   Specimen: BLOOD  Result Value Ref Range Status   Specimen Description BLOOD RIGHT UPPER ARM  Final   Special Requests   Final    BOTTLES DRAWN AEROBIC AND ANAEROBIC Blood Culture adequate volume   Culture   Final    NO GROWTH 1 DAY Performed at [facility], [address]  [place], [place] [address]    Report Status PENDING  Incomplete  Culture, blood (Routine X 2) w Reflex to ID Panel     Status: None (Preliminary result)   Collection Time: 07/26/20 12:57 PM   Specimen: BLOOD  Result Value Ref Range Status   Specimen Description BLOOD FOREARM  Final   Special Requests   Final    BOTTLES DRAWN AEROBIC AND ANAEROBIC Blood Culture results may not be optimal due to an excessive volume of blood received in culture bottles   Culture   Final    NO GROWTH < 24 HOURS Performed at [facility], [address], [place], [place] [address]    Report Status PENDING  Incomplete  Culture, blood (Routine X 2) w Reflex to ID Panel     Status: None (Preliminary result)   Collection Time: 07/26/20 12:57 PM   Specimen: BLOOD  Result Value Ref Range Status   Specimen Description BLOOD LEFT ANTECUBITAL  Final   Special Requests   Final    BOTTLES DRAWN AEROBIC AND ANAEROBIC Blood Culture adequate volume   Culture   Final    NO GROWTH < 24 HOURS Performed at [facility], [address] [place], [place] [address]    Report Status PENDING  Incomplete  Respiratory Panel by RT PCR (Flu A&B, Covid) - Nasopharyngeal Swab     Status: None   Collection Time: 07/26/20  3:22 PM   Specimen: Nasopharyngeal Swab  Result Value Ref Range Status   SARS Coronavirus 2 by RT PCR NEGATIVE NEGATIVE Final    Comment: (NOTE) SARS-CoV-2 target nucleic acids are NOT DETECTED.  The SARS-CoV-2 RNA is generally detectable in upper respiratoy specimens during the acute phase of infection. The lowest concentration of SARS-CoV-2 viral copies this assay can detect is 131 copies/mL. A negative result does not preclude SARS-Cov-2 infection and should not be used as the sole basis for treatment or other patient management decisions. A negative result may occur with  improper specimen collection/handling, submission of specimen other than nasopharyngeal swab, presence of viral  mutation(s) within the areas targeted by this assay, and inadequate number of viral copies (<131 copies/mL). A negative result must be combined with clinical observations, patient history, and epidemiological information. The expected result is Negative.  Fact Sheet for Patients:  https://www.moore.com/  Fact Sheet for Healthcare Providers:  https://www.young.biz/  This test is no t yet approved or cleared by the [place] FDA and  has been authorized for detection and/or diagnosis of SARS-CoV-2 by FDA under an Emergency Use Authorization (EUA). This EUA will remain  in effect (meaning this test can be used) for the duration of the COVID-19 declaration under Section 564(b)(1) of the Act, 21 U.S.C. section 360bbb-3(b)(1), unless the authorization is terminated or revoked sooner.     Influenza A by PCR NEGATIVE NEGATIVE Final   Influenza B by PCR NEGATIVE NEGATIVE Final    Comment: (NOTE) The Xpert Xpress SARS-CoV-2/FLU/RSV assay is intended as an aid in  the diagnosis of influenza from Nasopharyngeal swab specimens and  should not be used as a sole basis for  treatment. Nasal washings and  aspirates are unacceptable for Xpert Xpress SARS-CoV-2/FLU/RSV  testing.  Fact Sheet for Patients: https://www.moore.com/https://www.fda.gov/media/142436/download  Fact Sheet for Healthcare Providers: https://www.young.biz/https://www.fda.gov/media/142435/download  This test is not yet approved or cleared by the [place] FDA and  has been authorized for detection and/or diagnosis of SARS-CoV-2 by  FDA under an Emergency Use Authorization (EUA). This EUA will remain  in effect (meaning this test can be used) for the duration of the  Covid-19 declaration under Section 564(b)(1) of the Act, 21  U.S.C. section 360bbb-3(b)(1), unless the authorization is  terminated or revoked. Performed at [facility], [address] [place], [place] [address]     Procedures and diagnostic  studies:  CT Renal Stone Study  Result Date: 07/26/2020 CLINICAL DATA:  Right-sided flank pain EXAM: CT ABDOMEN AND PELVIS WITHOUT CONTRAST TECHNIQUE: Multidetector CT imaging of the abdomen and pelvis was performed following the standard protocol without IV contrast. COMPARISON:  None. FINDINGS: Lower chest: The lung bases are clear. The heart size is normal. Hepatobiliary: The liver is normal. Normal gallbladder.There is no biliary ductal dilation. Pancreas: Normal contours without ductal dilatation. No peripancreatic fluid collection. Spleen: Unremarkable. Adrenals/Urinary Tract: --Adrenal glands: Unremarkable. --Right kidney/ureter: The right kidney appears somewhat enlarged when compared to the left. There is mild adjacent fat stranding. There is no hydronephrosis. There is no radiopaque obstructing kidney stone. --Left kidney/ureter: No hydronephrosis or radiopaque kidney stones. --Urinary bladder: Unremarkable. Stomach/Bowel: --Stomach/Duodenum: No hiatal hernia or other gastric abnormality. Normal duodenal course and caliber. --Small bowel: Unremarkable. --Colon: Unremarkable. --Appendix: Normal. Vascular/Lymphatic: Normal course and caliber of the major abdominal vessels. --No retroperitoneal lymphadenopathy. --No mesenteric lymphadenopathy. --No pelvic or inguinal lymphadenopathy. Reproductive: There is an IUD in place. Other: No ascites or free air. The abdominal wall is normal. Musculoskeletal. No acute displaced fractures. IMPRESSION: 1. No hydronephrosis.  No radiopaque kidney stones. 2. There is several subtle findings involving the right kidney that raise suspicion for underlying right-sided pyelonephritis. Correlation with urinalysis is recommended. 3. Normal appendix. Electronically Signed   By: Katherine Mantlehristopher  Green M.D.   On: 07/26/2020 00:10               LOS: 1 day   Shawntia Mangal  [facility]   Pager on [email or web address]. If 7PM-7AM, please contact night-coverage at  [email or web address]     07/27/2020, 1:35 PM

## 2020-07-27 NOTE — Progress Notes (Signed)
   07/27/20 1932  Assess: MEWS Score  Temp (!) 103.1 F (39.5 C)  BP 129/76  Pulse Rate 95  Resp 20  SpO2 99 %  O2 Device Room Air  Assess: MEWS Score  MEWS Temp 2  MEWS Systolic 0  MEWS Pulse 0  MEWS RR 0  MEWS LOC 0  MEWS Score 2  MEWS Score Color Yellow  Assess: if the MEWS score is Yellow or Red  Were vital signs taken at a resting state? Yes  Focused Assessment Change from prior assessment (see assessment flowsheet)  Early Detection of Sepsis Score *See Row Information* Medium  MEWS guidelines implemented *See Row Information* Yes  Treat  MEWS Interventions Administered prn meds/treatments  Pain Scale 0-10  Pain Score 4  Pain Type Acute pain  Pain Location Head  Pain Descriptors / Indicators Aching  Pain Intervention(s) Relaxation  Patients response to intervention Effective  Take Vital Signs  Increase Vital Sign Frequency  Yellow: Q 2hr X 2 then Q 4hr X 2, if remains yellow, continue Q 4hrs  Escalate  MEWS: Escalate Yellow: discuss with charge nurse/RN and consider discussing with provider and RRT  Notify: Charge Nurse/RN  Name of Charge Nurse/RN Notified TConyers  Date Charge Nurse/RN Notified 07/27/20  Time Charge Nurse/RN Notified 1932  Notify: Provider  Provider Name/Title Reyes Ivan   Date Provider Notified 07/27/20  Time Provider Notified 1955  Notification Type Page  Notification Reason Change in status (temp)  Response Other (Comment) (tylenol given prior shift)  Date of Provider Response 07/27/20  Time of Provider Response 1956  Document  Patient Outcome Stabilized after interventions  Progress note created (see row info) Yes

## 2020-07-27 NOTE — TOC Initial Note (Addendum)
Transition of Care [facility]) - Initial/Assessment Note    Patient Details  Name: Erica Le MRN: [id number] Date of Birth: 22-Dec-1995  [facility] CM/SW Contact:    Margarito Liner, LCSW Phone Number: 07/27/2020, 10:14 AM  Clinical Narrative:   CSW met with patient. No supports at bedside. CSW introduced role. Patient confirmed she does not have a PCP. CSW provided booklet for free/low-cost healthcare in [place]. Encouraged her to take him home to review and see which practice is a good fit for her then call to make an appointment. Patient asking about financial assistance with her hospital bill. CSW sent referral to financial counselors. No further concerns. CSW encouraged patient to contact CSW as needed. CSW will continue to follow patient for support and facilitate return home when stable.   11:22 am: Patient has Family Planning Medicaid so someone from [employer] will call her to screen for Emergency or full Medicaid. Patient is aware. Also put intake paperwork for [facility] with her packet for free/low-cost healthcare.            Expected Discharge Plan: Home/Self Care Barriers to Discharge: Continued Medical Work up   Patient Goals and CMS Choice        Expected Discharge Plan and Services Expected Discharge Plan: Home/Self Care       Living arrangements for the past 2 months: Single Family Home                                      Prior Living Arrangements/Services Living arrangements for the past 2 months: Single Family Home   Patient language and need for interpreter reviewed:: Yes Do you feel safe going back to the place where you live?: Yes            Criminal Activity/Legal Involvement Pertinent to Current Situation/Hospitalization: No - Comment as needed  Activities of Daily Living Home Assistive Devices/Equipment: Eyeglasses ADL Screening (condition at time of admission) Patient's cognitive ability adequate to safely  complete daily activities?: Yes Is the patient deaf or have difficulty hearing?: No Does the patient have difficulty seeing, even when wearing glasses/contacts?: Yes Does the patient have difficulty concentrating, remembering, or making decisions?: No Patient able to express need for assistance with ADLs?: Yes Does the patient have difficulty dressing or bathing?: No Independently performs ADLs?: Yes (appropriate for developmental age) Does the patient have difficulty walking or climbing stairs?: No Weakness of Legs: None Weakness of Arms/Hands: None  Permission Sought/Granted Permission sought to share information with : Other (comment)       Permission granted to share info w AGENCY: Financial counselors        Emotional Assessment Appearance:: Appears stated age Attitude/Demeanor/Rapport: Engaged, Gracious Affect (typically observed): Accepting, Appropriate, Calm, Pleasant Orientation: : Oriented to Self, Oriented to Place, Oriented to  Time, Oriented to Situation Alcohol / Substance Use: Not Applicable Psych Involvement: No (comment)  Admission diagnosis:  Pyelonephritis [N12] Gram-negative bacteremia [R78.81] Pyelonephritis due to Escherichia coli [C59, B96.20] Patient Active Problem List   Diagnosis Date Noted  . Pyelonephritis due to Escherichia coli 07/26/2020  . E coli bacteremia 07/26/2020  . Exposure to hepatitis C 10/12/2019   PCP:  Patient, No Pcp Per Pharmacy:   [id number] [place], [place] - [address] [place] [address] Phone: [phone] Fax: [phone]     Social  Determinants of Health (SDOH) Interventions    Readmission Risk Interventions No flowsheet data found.  

## 2020-07-27 NOTE — Plan of Care (Signed)
  Problem: Education: Goal: Knowledge of General Education information will improve Description: Including pain rating scale, medication(s)/side effects and non-pharmacologic comfort measures 07/27/2020 1144 by Ansel Bong, RN Outcome: Progressing 07/27/2020 1144 by Ansel Bong, RN Outcome: Progressing   Problem: Health Behavior/Discharge Planning: Goal: Ability to manage health-related needs will improve 07/27/2020 1144 by Ansel Bong, RN Outcome: Progressing 07/27/2020 1144 by Ansel Bong, RN Outcome: Progressing   Problem: Clinical Measurements: Goal: Ability to maintain clinical measurements within normal limits will improve 07/27/2020 1144 by Ansel Bong, RN Outcome: Progressing 07/27/2020 1144 by Ansel Bong, RN Outcome: Progressing Goal: Will remain free from infection 07/27/2020 1144 by Ansel Bong, RN Outcome: Progressing 07/27/2020 1144 by Ansel Bong, RN Outcome: Progressing Goal: Diagnostic test results will improve 07/27/2020 1144 by Ansel Bong, RN Outcome: Progressing 07/27/2020 1144 by Ansel Bong, RN Outcome: Progressing Goal: Respiratory complications will improve 07/27/2020 1144 by Ansel Bong, RN Outcome: Progressing 07/27/2020 1144 by Ansel Bong, RN Outcome: Progressing Goal: Cardiovascular complication will be avoided 07/27/2020 1144 by Ansel Bong, RN Outcome: Progressing 07/27/2020 1144 by Ansel Bong, RN Outcome: Progressing   Problem: Activity: Goal: Risk for activity intolerance will decrease 07/27/2020 1144 by Ansel Bong, RN Outcome: Progressing 07/27/2020 1144 by Ansel Bong, RN Outcome: Progressing   Problem: Nutrition: Goal: Adequate nutrition will be maintained 07/27/2020 1144 by Ansel Bong, RN Outcome: Progressing 07/27/2020 1144 by Ansel Bong, RN Outcome: Progressing   Problem: Coping: Goal: Level of anxiety will decrease 07/27/2020 1144 by Ansel Bong, RN Outcome:  Progressing 07/27/2020 1144 by Ansel Bong, RN Outcome: Progressing   Problem: Elimination: Goal: Will not experience complications related to bowel motility 07/27/2020 1144 by Ansel Bong, RN Outcome: Progressing 07/27/2020 1144 by Ansel Bong, RN Outcome: Progressing Goal: Will not experience complications related to urinary retention 07/27/2020 1144 by Ansel Bong, RN Outcome: Progressing 07/27/2020 1144 by Ansel Bong, RN Outcome: Progressing   Problem: Pain Managment: Goal: General experience of comfort will improve 07/27/2020 1144 by Ansel Bong, RN Outcome: Progressing 07/27/2020 1144 by Ansel Bong, RN Outcome: Progressing   Problem: Safety: Goal: Ability to remain free from injury will improve 07/27/2020 1144 by Ansel Bong, RN Outcome: Progressing 07/27/2020 1144 by Ansel Bong, RN Outcome: Progressing   Problem: Skin Integrity: Goal: Risk for impaired skin integrity will decrease 07/27/2020 1144 by Ansel Bong, RN Outcome: Progressing 07/27/2020 1144 by Ansel Bong, RN Outcome: Progressing

## 2020-07-27 NOTE — Plan of Care (Signed)
Pt Axox4. Calm and cooperative and able to voice her needs. Tylenol prn administered for HA, effective. Pt on IVF and IV Abx. Pt able to ambulate to restroom with a steady gait. Pt denied nausea nor vomiting overnight. Vitals stable. Safety measures in place. Will continue to monitor.  Problem: Education: Goal: Knowledge of General Education information will improve Description: Including pain rating scale, medication(s)/side effects and non-pharmacologic comfort measures Outcome: Progressing   Problem: Health Behavior/Discharge Planning: Goal: Ability to manage health-related needs will improve Outcome: Progressing   Problem: Clinical Measurements: Goal: Ability to maintain clinical measurements within normal limits will improve Outcome: Progressing Goal: Will remain free from infection Outcome: Progressing Goal: Diagnostic test results will improve Outcome: Progressing Goal: Respiratory complications will improve Outcome: Progressing Goal: Cardiovascular complication will be avoided Outcome: Progressing   Problem: Activity: Goal: Risk for activity intolerance will decrease Outcome: Progressing   Problem: Nutrition: Goal: Adequate nutrition will be maintained Outcome: Progressing   Problem: Coping: Goal: Level of anxiety will decrease Outcome: Progressing   Problem: Elimination: Goal: Will not experience complications related to bowel motility Outcome: Progressing Goal: Will not experience complications related to urinary retention Outcome: Progressing   Problem: Pain Managment: Goal: General experience of comfort will improve Outcome: Progressing   Problem: Safety: Goal: Ability to remain free from injury will improve Outcome: Progressing   Problem: Skin Integrity: Goal: Risk for impaired skin integrity will decrease Outcome: Progressing

## 2020-07-28 MED ORDER — LEVOFLOXACIN 750 MG PO TABS
750.0000 mg | ORAL_TABLET | Freq: Every day | ORAL | Status: DC
  Administered 2020-07-28: 750 mg via ORAL
  Filled 2020-07-28: qty 1

## 2020-07-28 MED ORDER — LEVOFLOXACIN 750 MG PO TABS: 750.0000 mg | ORAL_TABLET | Freq: Every day | ORAL | 0 refills | Status: AC

## 2020-07-28 NOTE — Discharge Summary (Signed)
Physician Discharge Summary  Erica Le DJS:[id number] DOB: [birth year] DOA: 07/26/2020  PCP: Patient, No Pcp Per  Admit date: 07/26/2020 Discharge date: 07/28/2020  Discharge disposition: Home   Recommendations for Outpatient Follow-Up:   Follow-up with PCP in 1 week   Discharge Diagnosis:   Principal Problem:   E coli bacteremia Active Problems:   Pyelonephritis due to Escherichia coli    Discharge Condition: Stable.  Diet recommendation:  Diet Order            Diet - low sodium heart healthy           Diet regular Room service appropriate? Yes; Fluid consistency: Thin  Diet effective now                   Code Status: Full Code     Hospital Course:    Erica Le is a 24 y.o. 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  Symptoms had been going on for about 4 days prior to presentation. She was evaluated and discharged from the emergency room and she was sent home on antibiotics.  However, patient was called back to the hospital because initial blood culture that was done on 07/25/2020 showed E. Coli.  She was admitted to the hospital for E. coli bacteremia secondary to E. coli acute pyelonephritis.  She was treated with IV Rocephin.  Repeat blood cultures have been negative thus far.  Her condition has improved.  She is still stable for discharge today.  She will be discharged on oral Levaquin based on blood culture sensitivity report.  Risks aned benefits of Levaquin were discussed.      Discharge Exam:    Vitals:   07/27/20 2247 07/27/20 2336 07/28/20 0446 07/28/20 0900  BP: 117/60 105/75 112/67 109/62  Pulse: 65 72 63 72  Resp: 20 20 20 18   Temp: 98.1 F (36.7 C) 98.1 F (36.7 C) 98.2 F (36.8 C) 98.5 F (36.9 C)  TempSrc: Oral Oral Oral Oral  SpO2: 98% 99% 98% 97%  Weight:        Height:         GEN: NAD SKIN: No rash EYES: EOMI ENT: MMM CV: RRR PULM: CTA B ABD: soft, obese, mild suprapubic tenderness, +BS CNS: AAO x 3, non focal EXT: No edema or tenderness GU: Mild right CVA tenderness   The results of significant diagnostics from this hospitalization (including imaging, microbiology, ancillary and laboratory) are listed below for reference.     Procedures and Diagnostic Studies:   CT Renal Stone Study  Result Date: 07/26/2020 CLINICAL DATA:  Right-sided flank pain EXAM: CT ABDOMEN AND PELVIS WITHOUT CONTRAST TECHNIQUE: Multidetector CT imaging of the abdomen and pelvis was performed following the standard protocol without IV contrast. COMPARISON:  None. FINDINGS: Lower chest: The lung bases are clear. The heart size is normal. Hepatobiliary: The liver is normal. Normal gallbladder.There is no biliary ductal dilation. Pancreas: Normal contours without ductal dilatation. No peripancreatic fluid collection. Spleen: Unremarkable. Adrenals/Urinary Tract: --Adrenal glands: Unremarkable. --Right kidney/ureter: The right kidney appears somewhat enlarged when compared to the left. There is mild adjacent fat stranding. There is no hydronephrosis. There is no radiopaque obstructing kidney stone. --Left kidney/ureter: No hydronephrosis or radiopaque kidney stones. --Urinary bladder: Unremarkable. Stomach/Bowel: --Stomach/Duodenum: No hiatal hernia or other gastric abnormality. Normal duodenal course and caliber. --Small  bowel: Unremarkable. --Colon: Unremarkable. --Appendix: Normal. Vascular/Lymphatic: Normal course and caliber of the major abdominal vessels. --No retroperitoneal lymphadenopathy. --No mesenteric lymphadenopathy. --No pelvic or inguinal lymphadenopathy. Reproductive: There is an IUD in place. Other: No ascites or free air. The abdominal wall is normal. Musculoskeletal. No acute displaced fractures. IMPRESSION: 1. No hydronephrosis.  No radiopaque kidney  stones. 2. There is several subtle findings involving the right kidney that raise suspicion for underlying right-sided pyelonephritis. Correlation with urinalysis is recommended. 3. Normal appendix. Electronically Signed   By: Katherine Mantlehristopher  Green M.D.   On: 07/26/2020 00:10     Labs:   Basic Metabolic Panel: Recent Labs  Lab 07/25/20 2014 07/25/20 2014 07/26/20 1257 07/27/20 0540  NA 139  --  138 137  K 3.9   < > 4.1 4.0  CL 108  --  106 107  CO2 22  --  23 24  GLUCOSE 120*  --  114* 102*  BUN 16  --  14 9  CREATININE 1.02*  --  0.86 0.98  CALCIUM 8.4*  --  8.7* 8.2*   < > = values in this interval not displayed.   GFR Estimated Creatinine Clearance: 100.8 mL/min (by C-G formula based on SCr of 0.98 mg/dL). Liver Function Tests: Recent Labs  Lab 07/25/20 2014  AST 31  ALT 25  ALKPHOS 70  BILITOT 0.8  PROT 7.3  ALBUMIN 3.7   No results for input(s): LIPASE, AMYLASE in the last 168 hours. No results for input(s): AMMONIA in the last 168 hours. Coagulation profile No results for input(s): INR, PROTIME in the last 168 hours.  CBC: Recent Labs  Lab 07/25/20 2014 07/26/20 1257 07/27/20 0540  WBC 8.9 7.6 5.8  NEUTROABS 7.5  --   --   HGB 11.4* 11.5* 10.1*  HCT 34.3* 34.5* 30.3*  MCV 90.3 89.8 89.1  PLT 190 191 161   Cardiac Enzymes: No results for input(s): CKTOTAL, CKMB, CKMBINDEX, TROPONINI in the last 168 hours. BNP: Invalid input(s): POCBNP CBG: No results for input(s): GLUCAP in the last 168 hours. D-Dimer No results for input(s): DDIMER in the last 72 hours. Hgb A1c No results for input(s): HGBA1C in the last 72 hours. Lipid Profile No results for input(s): CHOL, HDL, LDLCALC, TRIG, CHOLHDL, LDLDIRECT in the last 72 hours. Thyroid function studies No results for input(s): TSH, T4TOTAL, T3FREE, THYROIDAB in the last 72 hours.  Invalid input(s): FREET3 Anemia work up No results for input(s): VITAMINB12, FOLATE, FERRITIN, TIBC, IRON, RETICCTPCT in the  last 72 hours. Microbiology Recent Results (from the past 240 hour(s))  Culture, blood (routine x 2)     Status: Abnormal (Preliminary result)   Collection Time: 07/25/20 12:06 AM   Specimen: BLOOD  Result Value Ref Range Status   Specimen Description   Final    BLOOD LEFT ANTECUBITAL Performed at [facility], [address] [place], [place] [address]    Special Requests   Final    BOTTLES DRAWN AEROBIC AND ANAEROBIC Blood Culture adequate volume Performed at [facility], [address] [place], [place] [address]    Culture  Setup Time   Final    Organism ID to follow IN BOTH AEROBIC AND ANAEROBIC BOTTLES GRAM NEGATIVE RODS CRITICAL RESULT CALLED TO, READ BACK BY AND VERIFIED WITH: Charise CarwinSTEPHANIE GANNON 07/26/20 1134 KLW Performed at [facility], [address] [place], [place] [address]    Culture ESCHERICHIA COLI (A)  Final   Report Status PENDING  Incomplete  Organism ID, Bacteria ESCHERICHIA COLI  Final      Susceptibility   Escherichia coli - MIC*    AMPICILLIN >=32 RESISTANT Resistant     CEFAZOLIN <=4 SENSITIVE Sensitive     CEFEPIME <=0.12 SENSITIVE Sensitive     CEFTAZIDIME <=1 SENSITIVE Sensitive     CEFTRIAXONE <=0.25 SENSITIVE Sensitive     CIPROFLOXACIN <=0.25 SENSITIVE Sensitive     GENTAMICIN <=1 SENSITIVE Sensitive     IMIPENEM <=0.25 SENSITIVE Sensitive     TRIMETH/SULFA <=20 SENSITIVE Sensitive     AMPICILLIN/SULBACTAM 16 INTERMEDIATE Intermediate     PIP/TAZO <=4 SENSITIVE Sensitive     * ESCHERICHIA COLI  Blood Culture ID Panel (Reflexed)     Status: Abnormal   Collection Time: 07/25/20 12:06 AM  Result Value Ref Range Status   Enterococcus faecalis NOT DETECTED NOT DETECTED Final   Enterococcus Faecium NOT DETECTED NOT DETECTED Final   Listeria monocytogenes NOT DETECTED NOT DETECTED Final   Staphylococcus species NOT DETECTED NOT DETECTED Final   Staphylococcus aureus (BCID) NOT DETECTED NOT DETECTED Final    Staphylococcus epidermidis NOT DETECTED NOT DETECTED Final   Staphylococcus lugdunensis NOT DETECTED NOT DETECTED Final   Streptococcus species NOT DETECTED NOT DETECTED Final   Streptococcus agalactiae NOT DETECTED NOT DETECTED Final   Streptococcus pneumoniae NOT DETECTED NOT DETECTED Final   Streptococcus pyogenes NOT DETECTED NOT DETECTED Final   A.calcoaceticus-baumannii NOT DETECTED NOT DETECTED Final   Bacteroides fragilis NOT DETECTED NOT DETECTED Final   Enterobacterales DETECTED (A) NOT DETECTED Final    Comment: Enterobacterales represent a large order of gram negative bacteria, not a single organism. CRITICAL RESULT CALLED TO, READ BACK BY AND VERIFIED WITH: STEPHANIE GANNON 07/26/20 1134 KLW    Enterobacter cloacae complex NOT DETECTED NOT DETECTED Final   Escherichia coli DETECTED (A) NOT DETECTED Final    Comment: CRITICAL RESULT CALLED TO, READ BACK BY AND VERIFIED WITH: STEPHANIE GANNON 07/26/20 1134 KLW    Klebsiella aerogenes NOT DETECTED NOT DETECTED Final   Klebsiella oxytoca NOT DETECTED NOT DETECTED Final   Klebsiella pneumoniae NOT DETECTED NOT DETECTED Final   Proteus species NOT DETECTED NOT DETECTED Final   Salmonella species NOT DETECTED NOT DETECTED Final   Serratia marcescens NOT DETECTED NOT DETECTED Final   Haemophilus influenzae NOT DETECTED NOT DETECTED Final   Neisseria meningitidis NOT DETECTED NOT DETECTED Final   Pseudomonas aeruginosa NOT DETECTED NOT DETECTED Final   Stenotrophomonas maltophilia NOT DETECTED NOT DETECTED Final   Candida albicans NOT DETECTED NOT DETECTED Final   Candida auris NOT DETECTED NOT DETECTED Final   Candida glabrata NOT DETECTED NOT DETECTED Final   Candida krusei NOT DETECTED NOT DETECTED Final   Candida parapsilosis NOT DETECTED NOT DETECTED Final   Candida tropicalis NOT DETECTED NOT DETECTED Final   Cryptococcus neoformans/gattii NOT DETECTED NOT DETECTED Final   CTX-M ESBL NOT DETECTED NOT DETECTED Final    Carbapenem resistance IMP NOT DETECTED NOT DETECTED Final   Carbapenem resistance KPC NOT DETECTED NOT DETECTED Final   Carbapenem resistance NDM NOT DETECTED NOT DETECTED Final   Carbapenem resist OXA 48 LIKE NOT DETECTED NOT DETECTED Final   Carbapenem resistance VIM NOT DETECTED NOT DETECTED Final    Comment: Performed at [facility], [address] [place], [place] [address]  Culture, blood (routine x 2)     Status: None (Preliminary result)   Collection Time: 07/25/20 12:07 AM   Specimen: BLOOD  Result Value Ref Range Status  Specimen Description BLOOD RIGHT UPPER ARM  Final   Special Requests   Final    BOTTLES DRAWN AEROBIC AND ANAEROBIC Blood Culture adequate volume   Culture   Final    NO GROWTH 2 DAYS Performed at [facility], [address] [place], [place] [address]    Report Status PENDING  Incomplete  Urine Culture     Status: Abnormal   Collection Time: 07/26/20 12:43 PM   Specimen: Urine, Random  Result Value Ref Range Status   Specimen Description   Final    URINE, RANDOM Performed at [facility], [address] [place], [place] [address]    Special Requests   Final    URINE, RANDOM Performed at [facility], [address], [place], [place] [address]    Culture (A)  Final    <10,000 COLONIES/mL INSIGNIFICANT GROWTH Performed at [facility], [address] [place], [place] [address]    Report Status 07/27/2020 FINAL  Final  Culture, blood (Routine X 2) w Reflex to ID Panel     Status: None (Preliminary result)   Collection Time: 07/26/20 12:57 PM   Specimen: BLOOD  Result Value Ref Range Status   Specimen Description BLOOD FOREARM  Final   Special Requests   Final    BOTTLES DRAWN AEROBIC AND ANAEROBIC Blood Culture results may not be optimal due to an excessive volume of blood received in culture bottles   Culture   Final    NO GROWTH 2 DAYS Performed at [facility], [address],  [place], [place] [address]    Report Status PENDING  Incomplete  Culture, blood (Routine X 2) w Reflex to ID Panel     Status: None (Preliminary result)   Collection Time: 07/26/20 12:57 PM   Specimen: BLOOD  Result Value Ref Range Status   Specimen Description BLOOD LEFT ANTECUBITAL  Final   Special Requests   Final    BOTTLES DRAWN AEROBIC AND ANAEROBIC Blood Culture adequate volume   Culture   Final    NO GROWTH 2 DAYS Performed at [facility], [address] [place], [place] [address]    Report Status PENDING  Incomplete  Respiratory Panel by RT PCR (Flu A&B, Covid) - Nasopharyngeal Swab     Status: None   Collection Time: 07/26/20  3:22 PM   Specimen: Nasopharyngeal Swab  Result Value Ref Range Status   SARS Coronavirus 2 by RT PCR NEGATIVE NEGATIVE Final    Comment: (NOTE) SARS-CoV-2 target nucleic acids are NOT DETECTED.  The SARS-CoV-2 RNA is generally detectable in upper respiratoy specimens during the acute phase of infection. The lowest concentration of SARS-CoV-2 viral copies this assay can detect is 131 copies/mL. A negative result does not preclude SARS-Cov-2 infection and should not be used as the sole basis for treatment or other patient management decisions. A negative result may occur with  improper specimen collection/handling, submission of specimen other than nasopharyngeal swab, presence of viral mutation(s) within the areas targeted by this assay, and inadequate number of viral copies (<131 copies/mL). A negative result must be combined with clinical observations, patient history, and epidemiological information. The expected result is Negative.  Fact Sheet for Patients:  https://www.moore.com/  Fact Sheet for Healthcare Providers:  https://www.young.biz/  This test is no t yet approved or cleared by the [place] FDA and  has been authorized for detection and/or diagnosis of SARS-CoV-2 by FDA under an  Emergency Use Authorization (EUA).  This EUA will remain  in effect (meaning this test can be used) for the duration of the COVID-19 declaration under Section 564(b)(1) of the Act, 21 U.S.C. section 360bbb-3(b)(1), unless the authorization is terminated or revoked sooner.     Influenza A by PCR NEGATIVE NEGATIVE Final   Influenza B by PCR NEGATIVE NEGATIVE Final    Comment: (NOTE) The Xpert Xpress SARS-CoV-2/FLU/RSV assay is intended as an aid in  the diagnosis of influenza from Nasopharyngeal swab specimens and  should not be used as a sole basis for treatment. Nasal washings and  aspirates are unacceptable for Xpert Xpress SARS-CoV-2/FLU/RSV  testing.  Fact Sheet for Patients: https://www.moore.com/  Fact Sheet for Healthcare Providers: https://www.young.biz/  This test is not yet approved or cleared by the [place] FDA and  has been authorized for detection and/or diagnosis of SARS-CoV-2 by  FDA under an Emergency Use Authorization (EUA). This EUA will remain  in effect (meaning this test can be used) for the duration of the  Covid-19 declaration under Section 564(b)(1) of the Act, 21  U.S.C. section 360bbb-3(b)(1), unless the authorization is  terminated or revoked. Performed at [facility], [address], [place], [place] [address]      Discharge Instructions:   Discharge Instructions    Diet - low sodium heart healthy   Complete by: As directed    Increase activity slowly   Complete by: As directed      Allergies as of 07/28/2020      Reactions   Apple Shortness Of Breath, Itching, Swelling   Other Anaphylaxis   Pears, plums, peaches      Medication List    STOP taking these medications   cephALEXin 500 MG capsule Commonly known as: KEFLEX   ondansetron 4 MG disintegrating tablet Commonly known as: Zofran ODT     TAKE these medications   acyclovir 800 MG tablet Commonly known as: ZOVIRAX Take 1  tablet (800 mg total) by mouth daily.   Biotin 10 MG Tabs Take 10 mg by mouth daily.   Immune Support Chew Chew 1 tablet by mouth daily.   levofloxacin 750 MG tablet Commonly known as: LEVAQUIN Take 1 tablet (750 mg total) by mouth daily for 4 days. Start taking on: July 29, 2020         Time coordinating discharge: 28 minutes  Signed:  Lurene Shadow  [facility] 07/28/2020, 10:48 AM   Pager on [email or web address]. If 7PM-7AM, please contact night-coverage at [email or web address]

## 2020-07-28 NOTE — Plan of Care (Signed)

## 2020-07-29 LAB — CULTURE, BLOOD (ROUTINE X 2): Special Requests: ADEQUATE

## 2020-07-31 LAB — CULTURE, BLOOD (ROUTINE X 2)
Culture: NO GROWTH
Culture: NO GROWTH
Culture: NO GROWTH
Special Requests: ADEQUATE
Special Requests: ADEQUATE

## 2020-08-30 ENCOUNTER — Ambulatory Visit (INDEPENDENT_AMBULATORY_CARE_PROVIDER_SITE_OTHER): Payer: Medicaid Other | Admitting: Obstetrics and Gynecology

## 2020-08-30 ENCOUNTER — Other Ambulatory Visit: Payer: Self-pay

## 2020-08-30 ENCOUNTER — Encounter: Payer: Self-pay | Admitting: Obstetrics and Gynecology

## 2020-08-30 VITALS — BP 115/74 | Ht 63.0 in | Wt 231.4 lb

## 2020-08-30 DIAGNOSIS — Z1239 Encounter for other screening for malignant neoplasm of breast: Secondary | ICD-10-CM

## 2020-08-30 DIAGNOSIS — Z113 Encounter for screening for infections with a predominantly sexual mode of transmission: Secondary | ICD-10-CM

## 2020-08-30 DIAGNOSIS — Z30432 Encounter for removal of intrauterine contraceptive device: Secondary | ICD-10-CM

## 2020-08-30 DIAGNOSIS — Z01419 Encounter for gynecological examination (general) (routine) without abnormal findings: Secondary | ICD-10-CM | POA: Diagnosis not present

## 2020-08-30 DIAGNOSIS — Z30011 Encounter for initial prescription of contraceptive pills: Secondary | ICD-10-CM | POA: Diagnosis not present

## 2020-08-30 NOTE — Progress Notes (Signed)
Gynecology Annual Exam  PCP: Patient, No Pcp Per  Chief Complaint:  Chief Complaint  Patient presents with  . Gynecologic Exam    Annual - ? IUD removal. RM 3    History of Present Illness: Patient is a 24 y.o. G0P0000 presents for annual exam. The patient has no complaints today.   LMP: No LMP recorded. (Menstrual status: IUD).  The patient is sexually active. She currently uses IUD for contraception. She denies dyspareunia.  The patient does perform self breast exams.  There is no notable family history of breast or ovarian cancer in her family.  The patient wears seatbelts: yes.  The patient has regular exercise: not asked.    The patient denies current symptoms of depression.    Review of Systems: Review of Systems  Constitutional: Negative for chills and fever.  HENT: Negative for congestion.   Respiratory: Negative for cough and shortness of breath.   Cardiovascular: Negative for chest pain and palpitations.  Gastrointestinal: Negative for abdominal pain, constipation, diarrhea, heartburn, nausea and vomiting.  Genitourinary: Negative for dysuria, frequency and urgency.  Skin: Negative for itching and rash.  Neurological: Negative for dizziness and headaches.  Endo/Heme/Allergies: Negative for polydipsia.  Psychiatric/Behavioral: Negative for depression.    Past Medical History:  Patient Active Problem List   Diagnosis Date Noted  . Pyelonephritis due to Escherichia coli 07/26/2020  . E coli bacteremia 07/26/2020  . Exposure to hepatitis C 10/12/2019    Past Surgical History:  Past Surgical History:  Procedure Laterality Date  . TONSILLECTOMY      Gynecologic History:  No LMP recorded. (Menstrual status: IUD). Contraception:Kyleena IUD Last Pap: Results were: 11/19/2017 no abnormalities   Obstetric History: G0P0000  Family History:  History reviewed. No pertinent family history.  Social History:  Social History   Socioeconomic History  .  Marital status: Single    Spouse name: Not on file  . Number of children: Not on file  . Years of education: Not on file  . Highest education level: Not on file  Occupational History  . Not on file  Tobacco Use  . Smoking status: Never Smoker  . Smokeless tobacco: Never Used  Vaping Use  . Vaping Use: Some days  Substance and Sexual Activity  . Alcohol use: Yes    Comment: 2x/mo  . Drug use: Never  . Sexual activity: Not Currently    Partners: Male    Birth control/protection: I.U.D.    Comment: current-Kylena  Other Topics Concern  . Not on file  Social History Narrative  . Not on file   Social Determinants of Health   Financial Resource Strain: Not on file  Food Insecurity: Not on file  Transportation Needs: Not on file  Physical Activity: Not on file  Stress: Not on file  Social Connections: Not on file  Intimate Partner Violence: Not on file    Allergies:  Allergies  Allergen Reactions  . Apple Shortness Of Breath, Itching and Swelling  . Other Anaphylaxis    Pears, plums, peaches    Medications: Prior to Admission medications   Medication Sig Start Date End Date Taking? Authorizing Provider  acyclovir (ZOVIRAX) 800 MG tablet Take 1 tablet (800 mg total) by mouth daily. 05/08/20 05/09/21 Yes Larene Pickett, FNP  Biotin 10 MG TABS Take 10 mg by mouth daily.   Yes [provider]  Multiple Vitamins-Minerals (IMMUNE SUPPORT) CHEW Chew 1 tablet by mouth daily.   Yes [provider]  Physical Exam Vitals: Blood pressure 115/74, height 5\' 3"  (1.6 m), weight 231 lb 6 oz (105 kg).  General: NAD HEENT: normocephalic, anicteric Thyroid: no enlargement, no palpable nodules Pulmonary: No increased work of breathing, CTAB Cardiovascular: RRR, distal pulses 2+ Breast: Breast symmetrical, no tenderness, no palpable nodules or masses, no skin or nipple retraction present, no nipple discharge.  No axillary or supraclavicular lymphadenopathy. Abdomen:  NABS, soft, non-tender, non-distended.  Umbilicus without lesions.  No hepatomegaly, splenomegaly or masses palpable. No evidence of hernia  Genitourinary:  External: Normal external female genitalia.  Normal urethral meatus, normal Bartholin's and Skene's glands.    Vagina: Normal vaginal mucosa, no evidence of prolapse.    Cervix: Grossly normal in appearance, no bleeding, IUD string visualized 3cm  Uterus: Non-enlarged, mobile, normal contour.  No CMT  Adnexa: ovaries non-enlarged, no adnexal masses  Rectal: deferred  Lymphatic: no evidence of inguinal lymphadenopathy Extremities: no edema, erythema, or tenderness Neurologic: Grossly intact Psychiatric: mood appropriate, affect full  Female chaperone present for pelvic and breast  portions of the physical exam  GYNECOLOGY OFFICE PROCEDURE NOTE  MALY LEMARR is a 24 y.o. G0P0000 here for 22  IUD removal placed 01/29/2018. She desires removal secondary to wanting to change to OCP..  IUD Removal  Patient identified, informed consent performed, consent signed.  Patient was in the dorsal lithotomy position, normal external genitalia was noted.  A speculum was placed in the patient's vagina, normal discharge was noted, no lesions. The cervix was visualized, no lesions, no abnormal discharge.  The strings of the IUD were grasped and pulled using ring forceps. The IUD was removed in its entirety.   Patient tolerated the procedure well.     Assessment: 24 y.o. G0P0000 routine annual exam, IUD removal  Plan: Problem List Items Addressed This Visit   None   Visit Diagnoses    Routine screening for STI (sexually transmitted infection)    -  Primary   Encounter for gynecological examination without abnormal finding       Breast screening       Encounter for IUD removal       Initiation of oral contraception          1) STI screening  was notoffered and therefore not obtained - UTD 05/01/2020 health department  2)  ASCCP guidelines  and rational discussed.  Patient opts for every 3 years screening interval  3) Contraception - the patient is currently using  IUD.  She is interested in changing to [facility] We discussed safe sex practices to reduce her furture risk of STI's.   - the patient was interested in IUD removal, initial placement was for AUB and we discussed that off hormonal contraception cycles may be irregular or heavy.  Patient is interested in switching to OCP  4) Return in about 1 year (around 08/30/2021) for annual.   09/01/2021, MD, [facility], [facility] 08/30/2020, 2:46 PM

## 2020-08-31 ENCOUNTER — Telehealth: Payer: Self-pay

## 2020-08-31 NOTE — Telephone Encounter (Signed)
Patient seen 08/30/20. Birth Control rx not received by pharmacy. YH#[phone]

## 2020-09-01 ENCOUNTER — Other Ambulatory Visit: Payer: Self-pay | Admitting: Obstetrics and Gynecology

## 2020-09-01 MED ORDER — LO LOESTRIN FE 1 MG-10 MCG / 10 MCG PO TABS: 1.0000 | ORAL_TABLET | Freq: Every day | ORAL | 3 refills | Status: AC

## 2020-10-09 NOTE — Telephone Encounter (Signed)
AMS sent rx 09/01/20

## 2021-05-30 ENCOUNTER — Ambulatory Visit: Payer: Medicaid Other

## 2021-05-31 ENCOUNTER — Encounter: Payer: Self-pay | Admitting: Family Medicine

## 2021-05-31 ENCOUNTER — Other Ambulatory Visit: Payer: Self-pay

## 2021-05-31 ENCOUNTER — Ambulatory Visit: Payer: Self-pay | Admitting: Family Medicine

## 2021-05-31 VITALS — BP 118/73 | HR 78 | Temp 96.7°F | Resp 16 | Ht 63.0 in | Wt 232.0 lb

## 2021-05-31 DIAGNOSIS — Z708 Other sex counseling: Secondary | ICD-10-CM

## 2021-05-31 DIAGNOSIS — B009 Herpesviral infection, unspecified: Secondary | ICD-10-CM

## 2021-05-31 HISTORY — DX: Herpesviral infection, unspecified: B00.9

## 2021-05-31 MED ORDER — ACYCLOVIR 800 MG PO TABS: 800.0000 mg | ORAL_TABLET | Freq: Every day | ORAL | 3 refills | Status: AC

## 2021-05-31 NOTE — Progress Notes (Signed)
Patient aware annual physical is due 09/05/21 and PAP as well.

## 2021-05-31 NOTE — Progress Notes (Signed)
S: Patient in clinic for HSV medication refill.  Patient reports needing refill.  Last out break was ~few months ago.     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  Dispense: 90 tablet; Refill: 3    2. Sexually transmitted disease counseling Discussed with patient about the need for annual visits with provider to get refills.  Declined need for other STI testing today.   RX sent to desired pharmacy      Wendi Snipes, FNP

## 2022-09-19 IMAGING — CT CT RENAL STONE PROTOCOL
2 of 5 series · 16 of 46 positions shown, 18 images · non-contrast
Comparison: None.

CLINICAL DATA: Right-sided flank pain

EXAM:
CT ABDOMEN AND PELVIS WITHOUT CONTRAST
TECHNIQUE: Multidetector CT imaging of the abdomen and pelvis was performed
following the standard protocol without IV contrast.

[Series 5: stone full standard 2 · axial · 0.74mm/px · z∈[-538,-88]mm · 13 of 102 slices shown, 15 images]
[im 6/102  soft-tissue]
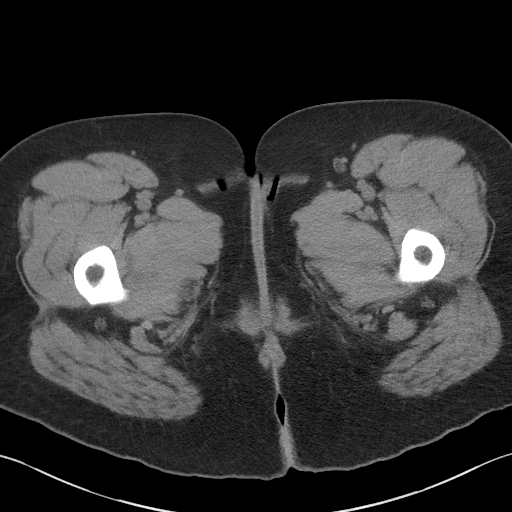
[im 6/102  bone]
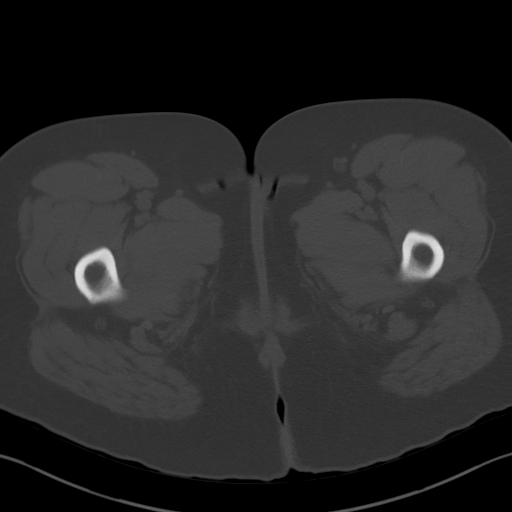
[im 16/102  soft-tissue]
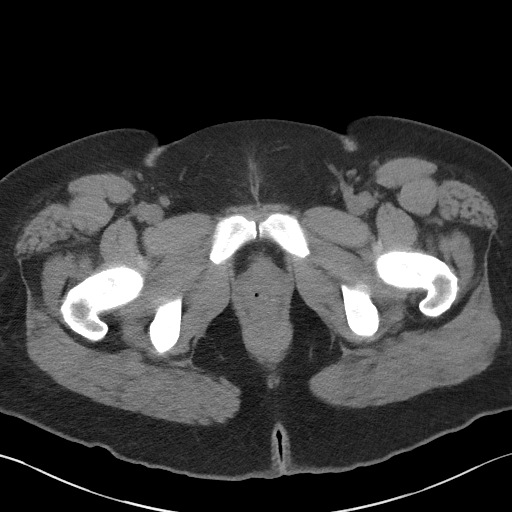
[im 22/102  soft-tissue]
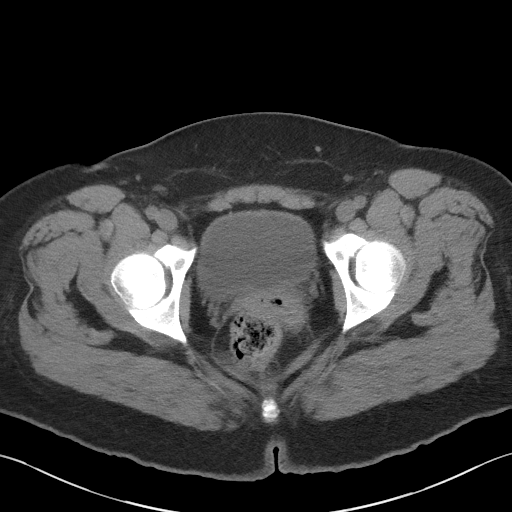
[im 27/102  soft-tissue]
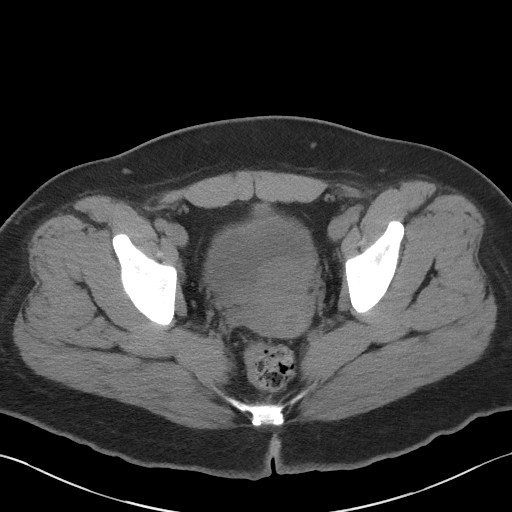
[im 38/102  soft-tissue]
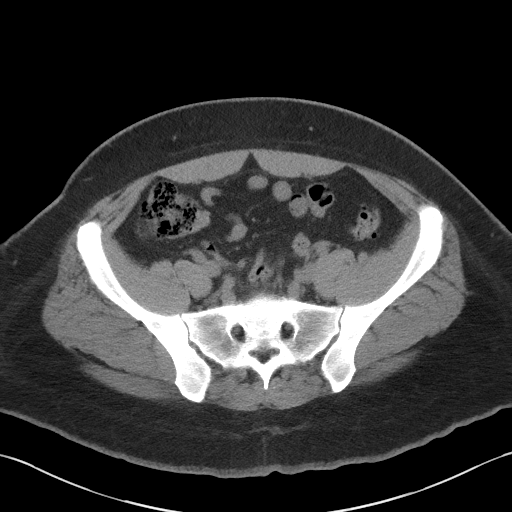
[im 43/102  soft-tissue]
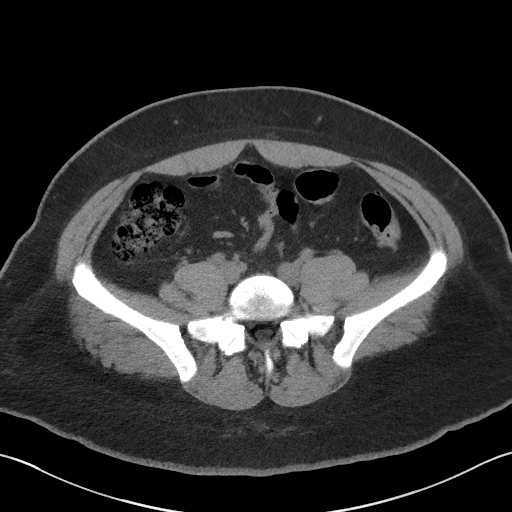
[im 54/102  soft-tissue]
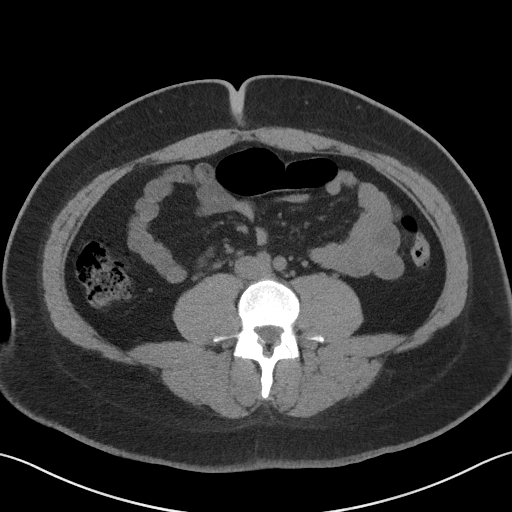
[im 59/102  soft-tissue]
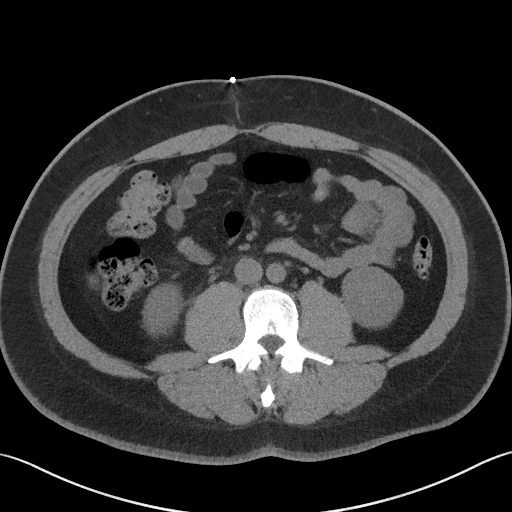
[im 64/102  soft-tissue]
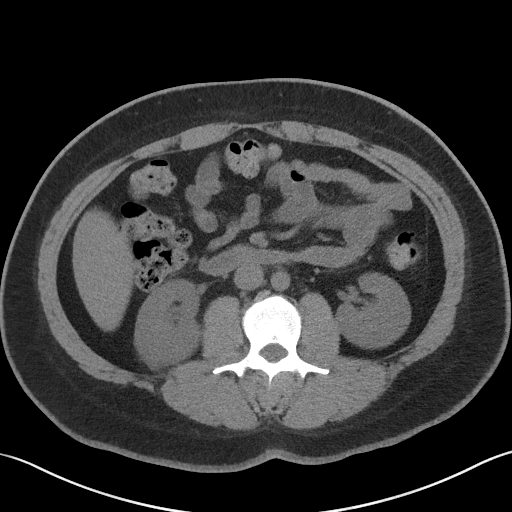
[im 64/102  bone]
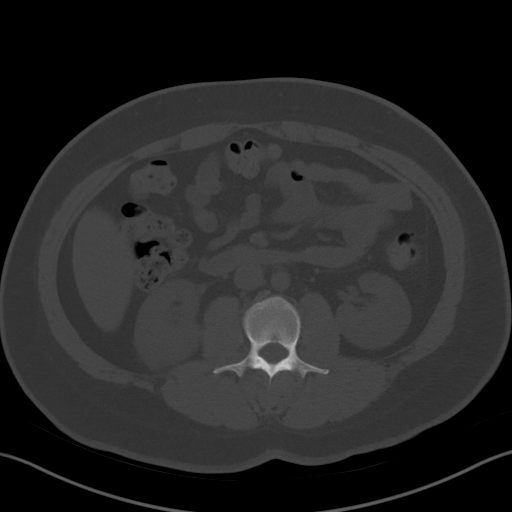
[im 75/102  soft-tissue]
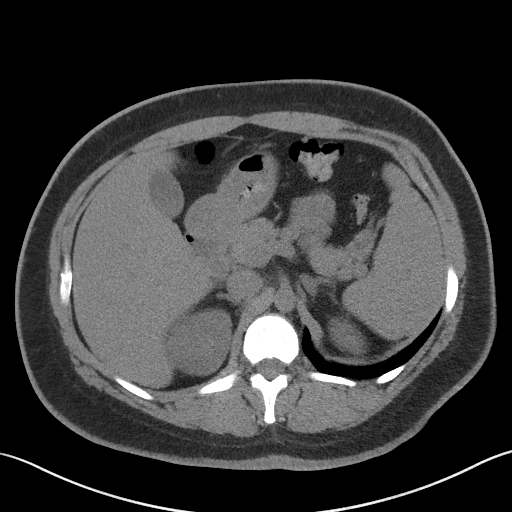
[im 80/102  soft-tissue]
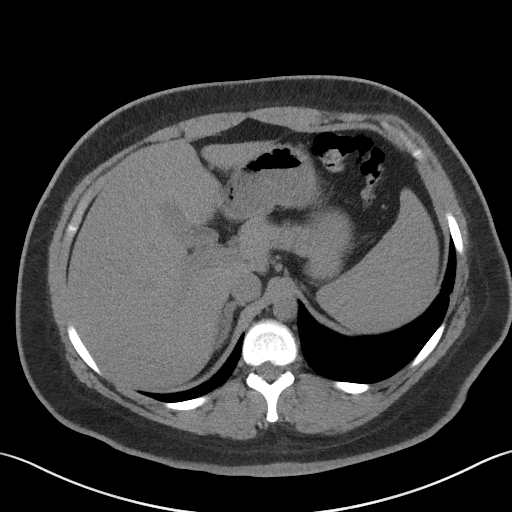
[im 86/102  soft-tissue]
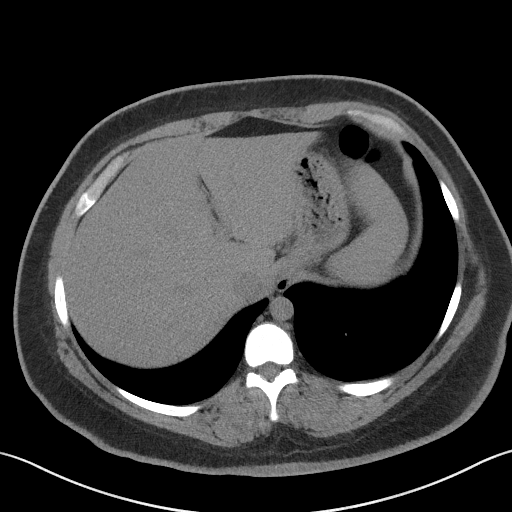
[im 96/102  soft-tissue]
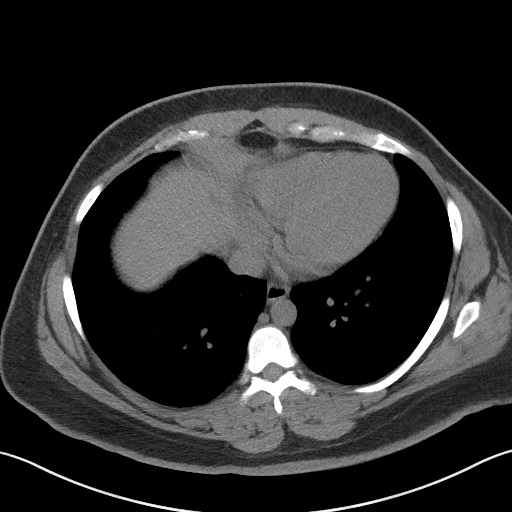

[Series 6: coronal · coronal · 0.93mm/px · 3 of 151 slices shown]
[im 51/151  soft-tissue]
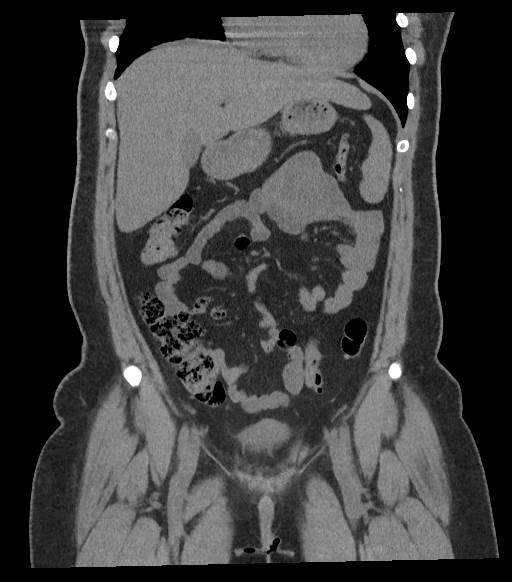
[im 67/151  soft-tissue]
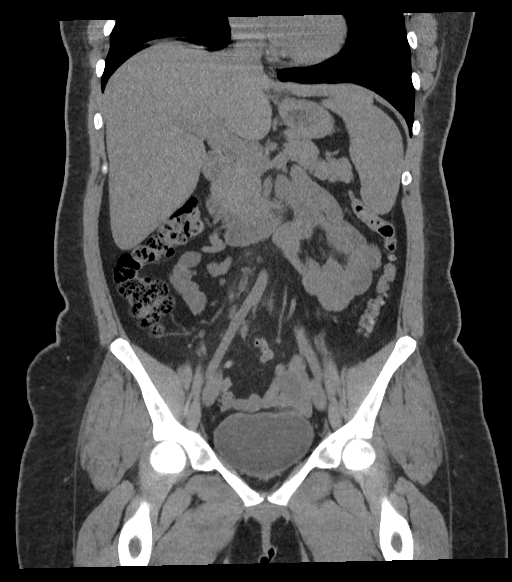
[im 84/151  soft-tissue]
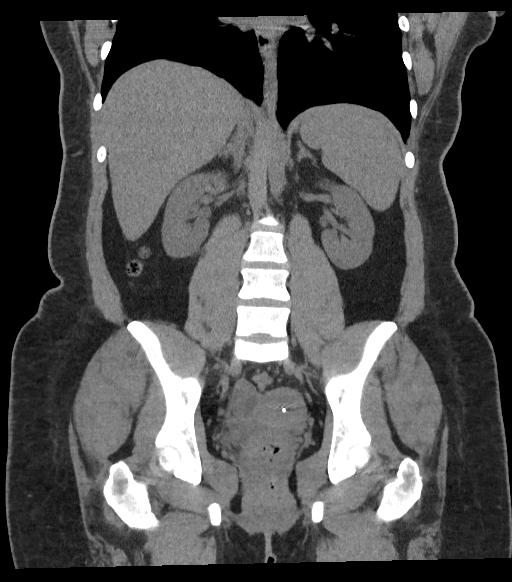

[16 of 46 positions shown; findings below may reference images not displayed]

FINDINGS: Lower chest: The lung bases are clear. The heart size is normal.

Hepatobiliary: The liver is normal. Normal gallbladder.There is no
biliary ductal dilation.

Pancreas: Normal contours without ductal dilatation. No
peripancreatic fluid collection.

Spleen: Unremarkable.

Adrenals/Urinary Tract:

--Adrenal glands: Unremarkable.

--Right kidney/ureter: The right kidney appears somewhat enlarged
when compared to the left. There is mild adjacent fat stranding.
There is no hydronephrosis. There is no radiopaque obstructing
kidney stone.

--Left kidney/ureter: No hydronephrosis or radiopaque kidney stones.

--Urinary bladder: Unremarkable.

Stomach/Bowel:

--Stomach/Duodenum: No hiatal hernia or other gastric abnormality.
Normal duodenal course and caliber.

--Small bowel: Unremarkable.

--Colon: Unremarkable.

--Appendix: Normal.

Vascular/Lymphatic: Normal course and caliber of the major abdominal
vessels.

--No retroperitoneal lymphadenopathy.

--No mesenteric lymphadenopathy.

--No pelvic or inguinal lymphadenopathy.

Reproductive: There is an IUD in place.

Other: No ascites or free air. The abdominal wall is normal.

Musculoskeletal. No acute displaced fractures.
IMPRESSION: 1. No hydronephrosis.  No radiopaque kidney stones.
2. There is several subtle findings involving the right kidney that
raise suspicion for underlying right-sided pyelonephritis.
Correlation with urinalysis is recommended.
3. Normal appendix.

## 2024-09-09 ENCOUNTER — Encounter: Payer: Self-pay | Admitting: Emergency Medicine

## 2024-09-09 ENCOUNTER — Emergency Department
Admission: EM | Admit: 2024-09-09 | Discharge: 2024-09-09 | Disposition: A | Discharge status: Home / Self Care | Attending: Emergency Medicine | Admitting: Emergency Medicine

## 2024-09-09 ENCOUNTER — Telehealth: Payer: Self-pay | Admitting: Emergency Medicine

## 2024-09-09 ENCOUNTER — Other Ambulatory Visit: Payer: Self-pay

## 2024-09-09 DIAGNOSIS — H9201 Otalgia, right ear: Secondary | ICD-10-CM | POA: Diagnosis present

## 2024-09-09 DIAGNOSIS — H60501 Unspecified acute noninfective otitis externa, right ear: Secondary | ICD-10-CM | POA: Insufficient documentation

## 2024-09-09 MED ORDER — NEOMYCIN-POLYMYXIN-HC 3.5-10000-1 OT SOLN: 4.0000 [drp] | Freq: Four times a day (QID) | OTIC | 0 refills | Status: AC

## 2024-09-09 MED ORDER — AMOXICILLIN-POT CLAVULANATE 875-125 MG PO TABS: 1.0000 | ORAL_TABLET | Freq: Two times a day (BID) | ORAL | 0 refills | Status: AC

## 2024-09-09 MED ORDER — ACETAMINOPHEN 500 MG PO TABS
1000.0000 mg | ORAL_TABLET | Freq: Once | ORAL | Status: AC
  Administered 2024-09-09: 1000 mg via ORAL
  Filled 2024-09-09: qty 2

## 2024-09-09 MED ORDER — NEOMYCIN-POLYMYXIN-HC 3.5-10000-1 OT SUSP
3.0000 [drp] | Freq: Four times a day (QID) | OTIC | Status: DC
  Administered 2024-09-09: 3 [drp] via OTIC
  Filled 2024-09-09: qty 10

## 2024-09-09 NOTE — ED Provider Notes (Signed)
 "  [facility] Provider Note    Event Date/Time   First MD Initiated Contact with Patient 09/09/24 0719     (approximate)   History   Otalgia   HPI  Erica Le is a 28 y.o. female   presents to the ED with complaint of right ear pain for approximately 3 days.  Patient has taken Tylenol  without any relief.  She does not report any fever or upper respiratory symptoms.      Physical Exam   Triage Vital Signs: ED Triage Vitals  Encounter Vitals Group     BP 09/09/24 0634 137/89     Girls Systolic BP Percentile --      Girls Diastolic BP Percentile --      Boys Systolic BP Percentile --      Boys Diastolic BP Percentile --      Pulse Rate 09/09/24 0633 92     Resp 09/09/24 0633 18     Temp 09/09/24 0633 98.6 F (37 C)     Temp Source 09/09/24 0633 Oral     SpO2 09/09/24 0633 97 %     Weight 09/09/24 0633 270 lb (122.5 kg)     Height 09/09/24 0633 5' 3 (1.6 m)     Head Circumference --      Peak Flow --      Pain Score 09/09/24 0633 8     Pain Loc --      Pain Education --      Exclude from Growth Chart --     Most recent vital signs: Vitals:   09/09/24 0633 09/09/24 0634  BP:  137/89  Pulse: 92   Resp: 18   Temp: 98.6 F (37 C)   SpO2: 97%      General: Awake, no distress.  CV:  Good peripheral perfusion.  Resp:  Normal effort.  Abd:  No distention.  Other:  Right EAC with edema and moderate tenderness.  Not able to visualize TM.  Tenderness on palpation of the right tragus.     ED Results / Procedures / Treatments   Labs (all labs ordered are listed, but only abnormal results are displayed) Labs Reviewed - No data to display    PROCEDURES: Otowick was applied to the right ear.  Critical Care performed:   Procedures   MEDICATIONS ORDERED IN ED: Medications  acetaminophen  (TYLENOL ) tablet 1,000 mg (1,000 mg Oral Given 09/09/24 0752)     IMPRESSION / MDM / ASSESSMENT AND PLAN / ED COURSE  I reviewed the triage  vital signs and the nursing notes.   Differential diagnosis includes, but is not limited to, otitis media, otitis externa, serous otitis, upper respiratory infection.  28 year old female presents to the ED with complaint of right ear pain.  Exam shows that she has a otitis externa and an otowick was placed along with Cortisporin  otic drops.  Patient also was given a prescription for antibiotics as I could not visualize the TM due to the canal edema.  Patient was given eardrops to take home with her to use every 6 hours.  She is to follow-up with Dr. Mcqueen who is on-call for [facility] if any continued problems or not improving.      Patient's presentation is most consistent with acute complicated illness / injury requiring diagnostic workup.  FINAL CLINICAL IMPRESSION(S) / ED DIAGNOSES   Final diagnoses:  Acute otitis externa of right ear, unspecified type     Rx / DC  Orders   ED Discharge Orders     None        Note:  This document was prepared using Dragon voice recognition software and may include unintentional dictation errors.   Saunders Shona CROME, PA-C 09/09/24 1411    Jossie Artist POUR, MD 09/11/24 [phone]  "

## 2024-09-09 NOTE — ED Triage Notes (Signed)
 Pt with right ear pain wince Wednesday night. Took Tylenol  without relief. Denies known fevers.

## 2024-09-09 NOTE — Telephone Encounter (Signed)
 Patient was mistakenly not given prescriptions on earlier visit today.

## 2024-09-09 NOTE — ED Notes (Signed)
 See triage note  Presents with pain to right ear  States pain started on Wednesday  Denies any fever or other sxs'  States he did have some drainage  Afebrile on arrival

## 2024-09-09 NOTE — Discharge Instructions (Signed)
 Follow-up with [facility] if any continued problems.  A prescription for an antibiotic was sent to the pharmacy and continue using the eardrops that were given to you while in the ED.  This is 3 drops every 6 hours.  The wick that is in your ear will fall out on its own.  Tylenol  or ibuprofen as needed for pain.
